# Patient Record
Sex: Female | Born: 1960
Health system: Southern US, Community
[De-identification: ages and names within clinical notes are randomized; demographics above are authoritative.]

## PROBLEM LIST (undated history)

## (undated) DIAGNOSIS — M542 Cervicalgia: Secondary | ICD-10-CM

## (undated) DIAGNOSIS — E559 Vitamin D deficiency, unspecified: Secondary | ICD-10-CM

## (undated) DIAGNOSIS — S6992XA Unspecified injury of left wrist, hand and finger(s), initial encounter: Secondary | ICD-10-CM

## (undated) DIAGNOSIS — K219 Gastro-esophageal reflux disease without esophagitis: Secondary | ICD-10-CM

## (undated) DIAGNOSIS — F988 Other specified behavioral and emotional disorders with onset usually occurring in childhood and adolescence: Secondary | ICD-10-CM

## (undated) DIAGNOSIS — F0781 Postconcussional syndrome: Secondary | ICD-10-CM

## (undated) DIAGNOSIS — F329 Major depressive disorder, single episode, unspecified: Secondary | ICD-10-CM

## (undated) DIAGNOSIS — F3289 Other specified depressive episodes: Secondary | ICD-10-CM

## (undated) DIAGNOSIS — N959 Unspecified menopausal and perimenopausal disorder: Secondary | ICD-10-CM

## (undated) HISTORY — DX: Gastro-esophageal reflux disease without esophagitis: K21.9

## (undated) HISTORY — DX: Vitamin D deficiency, unspecified: E55.9

## (undated) HISTORY — DX: Other specified behavioral and emotional disorders with onset usually occurring in childhood and adolescence: F98.8

## (undated) HISTORY — DX: Other specified depressive episodes: F32.89

## (undated) HISTORY — DX: Postconcussional syndrome: F07.81

## (undated) HISTORY — DX: Unspecified menopausal and perimenopausal disorder: N95.9

## (undated) HISTORY — DX: Unspecified injury of left wrist, hand and finger(s), initial encounter: S69.92XA

## (undated) HISTORY — DX: Cervicalgia: M54.2

## (undated) HISTORY — DX: Major depressive disorder, single episode, unspecified: F32.9

---

## 1983-04-24 HISTORY — PX: CHOLECYSTECTOMY: SHX55

## 1998-04-23 HISTORY — PX: BREAST SURGERY: SHX581

## 1999-02-08 ENCOUNTER — Ambulatory Visit (HOSPITAL_BASED_OUTPATIENT_CLINIC_OR_DEPARTMENT_OTHER): Admission: RE | Admit: 1999-02-08 | Discharge: 1999-02-08 | Payer: Self-pay | Admitting: Orthopedic Surgery

## 2000-12-06 ENCOUNTER — Other Ambulatory Visit: Admission: RE | Admit: 2000-12-06 | Discharge: 2000-12-06 | Payer: Self-pay | Admitting: *Deleted

## 2001-01-28 ENCOUNTER — Encounter: Admission: RE | Admit: 2001-01-28 | Discharge: 2001-01-28 | Payer: Self-pay | Admitting: *Deleted

## 2001-05-06 ENCOUNTER — Encounter (INDEPENDENT_AMBULATORY_CARE_PROVIDER_SITE_OTHER): Payer: Self-pay | Admitting: Specialist

## 2001-05-06 ENCOUNTER — Ambulatory Visit (HOSPITAL_BASED_OUTPATIENT_CLINIC_OR_DEPARTMENT_OTHER): Admission: RE | Admit: 2001-05-06 | Discharge: 2001-05-06 | Payer: Self-pay | Admitting: *Deleted

## 2002-02-23 ENCOUNTER — Other Ambulatory Visit: Admission: RE | Admit: 2002-02-23 | Discharge: 2002-02-23 | Payer: Self-pay | Admitting: Obstetrics and Gynecology

## 2004-04-06 ENCOUNTER — Other Ambulatory Visit: Admission: RE | Admit: 2004-04-06 | Discharge: 2004-04-06 | Payer: Self-pay | Admitting: Internal Medicine

## 2005-05-31 ENCOUNTER — Other Ambulatory Visit: Admission: RE | Admit: 2005-05-31 | Discharge: 2005-05-31 | Payer: Self-pay | Admitting: Internal Medicine

## 2007-08-21 ENCOUNTER — Encounter: Admission: RE | Admit: 2007-08-21 | Discharge: 2007-08-21 | Payer: Self-pay | Admitting: Internal Medicine

## 2008-05-24 DIAGNOSIS — F0781 Postconcussional syndrome: Secondary | ICD-10-CM

## 2008-05-24 HISTORY — DX: Postconcussional syndrome: F07.81

## 2008-06-03 ENCOUNTER — Emergency Department (HOSPITAL_COMMUNITY): Admission: EM | Admit: 2008-06-03 | Discharge: 2008-06-03 | Payer: Self-pay | Admitting: Emergency Medicine

## 2008-06-07 ENCOUNTER — Ambulatory Visit (HOSPITAL_COMMUNITY): Admission: RE | Admit: 2008-06-07 | Discharge: 2008-06-07 | Payer: Self-pay | Admitting: Internal Medicine

## 2008-08-06 ENCOUNTER — Encounter: Payer: Self-pay | Admitting: Internal Medicine

## 2008-08-12 ENCOUNTER — Encounter: Admission: RE | Admit: 2008-08-12 | Discharge: 2008-08-12 | Payer: Self-pay | Admitting: Neurological Surgery

## 2008-08-16 ENCOUNTER — Encounter: Payer: Self-pay | Admitting: Internal Medicine

## 2008-10-14 ENCOUNTER — Encounter: Payer: Self-pay | Admitting: Internal Medicine

## 2008-10-21 ENCOUNTER — Ambulatory Visit: Payer: Self-pay | Admitting: Internal Medicine

## 2008-10-21 DIAGNOSIS — F988 Other specified behavioral and emotional disorders with onset usually occurring in childhood and adolescence: Secondary | ICD-10-CM

## 2008-10-21 DIAGNOSIS — F0781 Postconcussional syndrome: Secondary | ICD-10-CM | POA: Insufficient documentation

## 2008-10-21 LAB — CONVERTED CEMR LAB
Basophils Absolute: 0.1 10*3/uL (ref 0.0–0.1)
HCT: 40.8 % (ref 36.0–46.0)
Hemoglobin: 13.8 g/dL (ref 12.0–15.0)
Lymphs Abs: 1.5 10*3/uL (ref 0.7–4.0)
MCV: 93.6 fL (ref 78.0–100.0)
Monocytes Relative: 6.3 % (ref 3.0–12.0)
Neutro Abs: 4.8 10*3/uL (ref 1.4–7.7)
RDW: 12.3 % (ref 11.5–14.6)
TSH: 1.64 microintl units/mL (ref 0.35–5.50)

## 2008-10-26 ENCOUNTER — Encounter (INDEPENDENT_AMBULATORY_CARE_PROVIDER_SITE_OTHER): Payer: Self-pay | Admitting: *Deleted

## 2008-10-26 ENCOUNTER — Encounter: Payer: Self-pay | Admitting: Internal Medicine

## 2008-10-26 DIAGNOSIS — E559 Vitamin D deficiency, unspecified: Secondary | ICD-10-CM | POA: Insufficient documentation

## 2008-11-08 ENCOUNTER — Encounter: Payer: Self-pay | Admitting: Internal Medicine

## 2008-11-26 ENCOUNTER — Telehealth: Payer: Self-pay | Admitting: Internal Medicine

## 2008-12-02 ENCOUNTER — Ambulatory Visit: Payer: Self-pay | Admitting: Internal Medicine

## 2008-12-02 DIAGNOSIS — G609 Hereditary and idiopathic neuropathy, unspecified: Secondary | ICD-10-CM | POA: Insufficient documentation

## 2008-12-02 DIAGNOSIS — R259 Unspecified abnormal involuntary movements: Secondary | ICD-10-CM | POA: Insufficient documentation

## 2008-12-02 DIAGNOSIS — F329 Major depressive disorder, single episode, unspecified: Secondary | ICD-10-CM

## 2008-12-17 ENCOUNTER — Ambulatory Visit: Payer: Self-pay | Admitting: Psychology

## 2008-12-30 ENCOUNTER — Telehealth: Payer: Self-pay | Admitting: Internal Medicine

## 2008-12-31 ENCOUNTER — Ambulatory Visit: Payer: Self-pay | Admitting: Internal Medicine

## 2009-01-06 ENCOUNTER — Telehealth: Payer: Self-pay | Admitting: Internal Medicine

## 2009-01-06 ENCOUNTER — Encounter: Payer: Self-pay | Admitting: Internal Medicine

## 2009-02-01 ENCOUNTER — Telehealth: Payer: Self-pay | Admitting: Internal Medicine

## 2009-02-04 ENCOUNTER — Ambulatory Visit: Payer: Self-pay | Admitting: Internal Medicine

## 2009-02-08 ENCOUNTER — Encounter: Payer: Self-pay | Admitting: Internal Medicine

## 2009-03-07 ENCOUNTER — Ambulatory Visit: Payer: Self-pay | Admitting: Internal Medicine

## 2009-04-12 ENCOUNTER — Telehealth: Payer: Self-pay | Admitting: Internal Medicine

## 2009-05-16 ENCOUNTER — Telehealth: Payer: Self-pay | Admitting: Internal Medicine

## 2009-06-03 ENCOUNTER — Ambulatory Visit: Payer: Self-pay | Admitting: Internal Medicine

## 2009-07-13 ENCOUNTER — Telehealth: Payer: Self-pay | Admitting: Internal Medicine

## 2009-08-17 ENCOUNTER — Telehealth: Payer: Self-pay | Admitting: Internal Medicine

## 2009-09-16 ENCOUNTER — Telehealth: Payer: Self-pay | Admitting: Internal Medicine

## 2009-10-11 ENCOUNTER — Ambulatory Visit: Payer: Self-pay | Admitting: Internal Medicine

## 2009-10-11 DIAGNOSIS — K117 Disturbances of salivary secretion: Secondary | ICD-10-CM | POA: Insufficient documentation

## 2009-10-12 LAB — CONVERTED CEMR LAB
Folate: 14.1 ng/mL
TSH: 1.33 microintl units/mL (ref 0.35–5.50)
Vitamin B-12: 308 pg/mL (ref 211–911)

## 2009-11-21 ENCOUNTER — Telehealth: Payer: Self-pay | Admitting: Internal Medicine

## 2009-12-23 ENCOUNTER — Telehealth: Payer: Self-pay | Admitting: Internal Medicine

## 2010-01-20 ENCOUNTER — Telehealth (INDEPENDENT_AMBULATORY_CARE_PROVIDER_SITE_OTHER): Payer: Self-pay | Admitting: *Deleted

## 2010-01-30 ENCOUNTER — Telehealth: Payer: Self-pay | Admitting: Internal Medicine

## 2010-03-06 ENCOUNTER — Telehealth: Payer: Self-pay | Admitting: Internal Medicine

## 2010-03-28 ENCOUNTER — Telehealth: Payer: Self-pay | Admitting: Internal Medicine

## 2010-04-11 ENCOUNTER — Telehealth: Payer: Self-pay | Admitting: Internal Medicine

## 2010-05-13 ENCOUNTER — Encounter: Payer: Self-pay | Admitting: Internal Medicine

## 2010-05-21 LAB — CONVERTED CEMR LAB
ALT: 20 units/L (ref 0–35)
AST: 21 units/L (ref 0–37)
BUN: 10 mg/dL (ref 6–23)
Bilirubin Urine: NEGATIVE
Bilirubin, Direct: 0.2 mg/dL (ref 0.0–0.3)
Cholesterol: 161 mg/dL (ref 0–200)
Creatinine, Ser: 0.9 mg/dL (ref 0.4–1.2)
Eosinophils Relative: 1 % (ref 0.0–5.0)
GFR calc non Af Amer: 71.23 mL/min (ref >60)
Ketones, ur: NEGATIVE mg/dL
LDL Cholesterol: 82 mg/dL (ref 0–99)
Monocytes Relative: 7.5 % (ref 3.0–12.0)
Neutrophils Relative %: 68 % (ref 43.0–77.0)
Nitrite: NEGATIVE
Platelets: 234 10*3/uL (ref 150.0–400.0)
Total Bilirubin: 0.4 mg/dL (ref 0.3–1.2)
Total Protein, Urine: NEGATIVE mg/dL
Triglycerides: 36 mg/dL (ref 0.0–149.0)
VLDL: 7.2 mg/dL (ref 0.0–40.0)
WBC: 6.3 10*3/uL (ref 4.5–10.5)
pH: 6.5 (ref 5.0–8.0)

## 2010-05-23 NOTE — Progress Notes (Signed)
  Phone Note Outgoing Call   Details for Reason: Shred rx Summary of Call: Pt called for rx for vyvance on 03/06/10. Never pick-up. Putting in shred box Initial call taken by: Orlan Leavens RMA,  March 28, 2010 1:39 PM

## 2010-05-23 NOTE — Progress Notes (Signed)
Summary: Adderall  Phone Note Call from Patient Call back at Home Phone [phone]   Caller: Patient Summary of Call: pt called requesting refill of Adderall Initial call taken by: Margaret Pyle, CMA,  May 16, 2009 11:58 AM  Follow-up for Phone Call        ok- please print and i will sign - thanks Follow-up by: Newt Lukes MD,  May 16, 2009 12:02 PM  Additional Follow-up for Phone Call Additional follow up Details #1::        rx signed by MD, pt informed, rx in cabinet for pick up Additional Follow-up by: Margaret Pyle, CMA,  May 16, 2009 2:45 PM    Prescriptions: ADDERALL 20 MG TABS (AMPHETAMINE-DEXTROAMPHETAMINE) take 1 by mouth once daily  #30 x 0   Entered by:   Margaret Pyle, CMA   Authorized by:   Newt Lukes MD   Signed by:   Margaret Pyle, CMA on 05/16/2009   Method used:   Print then Give to Patient   RxID:   [id number]

## 2010-05-23 NOTE — Progress Notes (Signed)
Summary: vyvanse  Phone Note Call from Patient Call back at Home Phone [phone]   Caller: Patient Reason for Call: Refill Medication Summary of Call: Pt called requesting refill of Vyvanse-[facility] pt Initial call taken by: Brenton Grills MA,  December 23, 2009 3:20 PM  Follow-up for Phone Call        ok to print and i will sign - thanks Follow-up by: Newt Lukes MD,  December 27, 2009 8:22 AM  Additional Follow-up for Phone Call Additional follow up Details #1::        Pt informed Rx in cabinet for pt pick up Additional Follow-up by: Margaret Pyle, CMA,  December 27, 2009 8:25 AM    Prescriptions: VYVANSE 30 MG CAPS (LISDEXAMFETAMINE DIMESYLATE) 1 by mouth every morning  #30 x 0   Entered by:   Margaret Pyle, CMA   Authorized by:   Newt Lukes MD   Signed by:   Margaret Pyle, CMA on 12/27/2009   Method used:   Print then Give to Patient   RxID:   [id number]

## 2010-05-23 NOTE — Progress Notes (Signed)
Summary: Vyvanse  Phone Note Call from Patient Call back at Home Phone [phone]   Caller: Patient Summary of Call: pt called requesting refills of Vyvanse Initial call taken by: Margaret Pyle, CMA,  July 13, 2009 9:39 AM  Follow-up for Phone Call        ok - pls print and i will sign for pt to pick up Follow-up by: Newt Lukes MD,  July 13, 2009 11:00 AM  Additional Follow-up for Phone Call Additional follow up Details #1::        pt informed via VM, rx in cabinet for pt pick up Additional Follow-up by: Margaret Pyle, CMA,  July 13, 2009 11:46 AM    Prescriptions: VYVANSE 30 MG CAPS (LISDEXAMFETAMINE DIMESYLATE) 1 by mouth every morning  #30 x 0   Entered by:   Orlan Leavens   Authorized by:   Newt Lukes MD   Signed by:   Orlan Leavens on 07/13/2009   Method used:   Print then Give to Patient   RxID:   [id number]

## 2010-05-23 NOTE — Assessment & Plan Note (Signed)
Summary: DRY MOUTH/NWS   Vital Signs:  Patient profile:   50 year old female Height:      64 inches (162.56 cm) Weight:      131 pounds (59.55 kg) BMI:     22.57 O2 Sat:      97 % on Room air Temp:     97.8 degrees F (36.56 degrees C) oral Pulse rate:   63 / minute BP sitting:   92 / 70  (left arm) Cuff size:   regular  Vitals Entered By: Orlan Leavens (October 11, 2009 1:33 PM)  O2 Flow:  Room air CC: Dry mouth Is Patient Diabetic? No Pain Assessment Patient in pain? no        Primary Care Provider:  Newt Lukes MD  CC:  Dry mouth.  History of Present Illness: c/o dry mouth also dry eye, dry skin, dry hair no change in meds -  ADHD - better on vyvanse than adderall  Clinical Review Panels:  Lipid Management   Cholesterol:  161 (06/03/2009)   LDL (bad choesterol):  82 (06/03/2009)   HDL (good cholesterol):  72.20 (06/03/2009)  CBC   WBC:  6.3 (06/03/2009)   RBC:  4.06 (06/03/2009)   Hgb:  13.2 (06/03/2009)   Hct:  39.4 (06/03/2009)   Platelets:  234.0 (06/03/2009)   MCV  97.0 (06/03/2009)   MCHC  33.5 (06/03/2009)   RDW  12.3 (06/03/2009)   PMN:  68.0 (06/03/2009)   Lymphs:  20.8 (06/03/2009)   Monos:  7.5 (06/03/2009)   Eosinophils:  1.0 (06/03/2009)   Basophil:  2.7 (06/03/2009)  Complete Metabolic Panel   Glucose:  99 (06/03/2009)   Sodium:  138 (06/03/2009)   Potassium:  4.6 (06/03/2009)   Chloride:  102 (06/03/2009)   CO2:  26 (06/03/2009)   BUN:  10 (06/03/2009)   Creatinine:  0.9 (06/03/2009)   Albumin:  3.9 (06/03/2009)   Total Protein:  6.6 (06/03/2009)   Calcium:  9.3 (06/03/2009)   Total Bili:  0.4 (06/03/2009)   Alk Phos:  41 (06/03/2009)   SGPT (ALT):  20 (06/03/2009)   SGOT (AST):  21 (06/03/2009)   Current Medications (verified): 1)  Vyvanse 30 Mg Caps (Lisdexamfetamine Dimesylate) .Marland Kitchen.. 1 By Mouth Every Morning 2)  Vitamin D3 1000 Unit Tabs (Cholecalciferol) .... 2 Tabs By Mouth Once Daily 3)  Prozac 10 Mg Caps  (Fluoxetine Hcl) .... 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 Danielle Dess depression ADD  Review of Systems  The patient denies dyspnea on exertion, headaches, and abdominal pain.    Physical Exam  General:  alert, well-developed, well-nourished, and cooperative to examination.    Nose:  External nasal examination shows no deformity or inflammation. Nasal mucosa are pink and moist without lesions or exudates. Mouth:  teeth and gums in good repair; mucous membranes dry but without lesions or ulcers. oropharynx clear without exudate or erythema.  Lungs:  normal respiratory effort, no intercostal retractions or use of accessory muscles; normal breath sounds bilaterally - no crackles and no wheezes.    Heart:  normal rate, regular rhythm, no murmur, and no rub. BLE without edema.   Impression & Recommendations:  Problem # 1:  DRY MOUTH (ICD-527.7)  ?med effect of vyvanse or prozac - r/o med dz with labs - consider pilocarpine if sugarfee gum/candy  not helpful  Orders: TLB-B12 + Folate Pnl (82746_82607-B12/FOL) TLB-TSH (Thyroid Stimulating Hormone) (84443-TSH) T-Sjogrens Syndrome Ab ([address])  Problem # 2:  ADD (ICD-314.00) cont vyvanse as symptoms are better controlled and longer acting benefit than prior adderall tx  Complete Medication List: 1)  Vyvanse 30 Mg Caps (Lisdexamfetamine dimesylate) .Marland Kitchen.. 1 by mouth every morning 2)  Vitamin D3 1000 Unit Tabs (Cholecalciferol) .... 2 tabs by mouth once daily 3)  Prozac 10 Mg Caps (Fluoxetine hcl) .... Once daily  Patient Instructions: 1)  it was good to see you today.  2)  test(s) ordered today for evaluation of dry mouth- your results will be posted on the phone tree for review in 48-72 hours from the time of test completion; call [phone] and enter  your 9 digit MRN (listed above on this page, just below your name); if any changes need to be made or there are abnormal results, you will be contacted directly.  3)  continue sugarless gum/candy pending these results 4)  continue vyvanse as discussed for ADHD - 5)  Please schedule a follow-up appointment in 3-4 months to re-evaluate ADHD symptoms, sooner if problems.  Prescriptions: VYVANSE 30 MG CAPS (LISDEXAMFETAMINE DIMESYLATE) 1 by mouth every morning  #30 x 0   Entered and Authorized by:   Newt Lukes MD   Signed by:   Newt Lukes MD on 10/11/2009   Method used:   Print then Give to Patient   RxID:   [id number]

## 2010-05-23 NOTE — Progress Notes (Signed)
Summary: Vyvanse  Phone Note Call from Patient Call back at Home Phone [phone]   Caller: Patient Summary of Call: Pt called requesting refill of Vyvanse, okay to generate Rx for signature? Initial call taken by: Margaret Pyle, CMA,  March 06, 2010 8:34 AM  Follow-up for Phone Call        yes - thanks Follow-up by: Newt Lukes MD,  March 06, 2010 9:45 AM  Additional Follow-up for Phone Call Additional follow up Details #1::        Pt informed, Rx in cabinet for pt pick up Additional Follow-up by: Margaret Pyle, CMA,  March 06, 2010 11:04 AM    Prescriptions: VYVANSE 30 MG CAPS (LISDEXAMFETAMINE DIMESYLATE) 1 by mouth every morning  #30 x 0   Entered by:   Margaret Pyle, CMA   Authorized by:   Newt Lukes MD   Signed by:   Margaret Pyle, CMA on 03/06/2010   Method used:   Print then Give to Patient   RxID:   [id number]

## 2010-05-23 NOTE — Progress Notes (Signed)
  Phone Note Other Incoming   Request: Send information Summary of Call: Request for records received from [facility]. Request forwarded to [facility].     

## 2010-05-23 NOTE — Progress Notes (Signed)
Summary: Vyvanse  Phone Note Call from Patient Call back at Home Phone [phone]   Caller: Patient Summary of Call: Pt called requesting refill of Vyvanse Initial call taken by: Margaret Pyle, CMA,  November 21, 2009 11:23 AM  Follow-up for Phone Call        ok - done Follow-up by: Newt Lukes MD,  November 21, 2009 12:01 PM  Additional Follow-up for Phone Call Additional follow up Details #1::        pt informed via VM, Rx in cabinet for pt pick up Additional Follow-up by: Margaret Pyle, CMA,  November 21, 2009 12:07 PM    Prescriptions: VYVANSE 30 MG CAPS (LISDEXAMFETAMINE DIMESYLATE) 1 by mouth every morning  #30 x 0   Entered and Authorized by:   Newt Lukes MD   Signed by:   Newt Lukes MD on 11/21/2009   Method used:   Print then Give to Patient   RxID:   [id number]

## 2010-05-23 NOTE — Progress Notes (Signed)
Summary: Vyvanse  Phone Note Call from Patient Call back at Home Phone [phone] Call back at Work Phone [phone]   Caller: Patient Summary of Call: pt called requesting refill of Vyvanse Initial call taken by: Margaret Pyle, CMA,  Sep 16, 2009 10:22 AM  Follow-up for Phone Call        done Follow-up by: Newt Lukes MD,  Sep 16, 2009 10:29 AM  Additional Follow-up for Phone Call Additional follow up Details #1::        pt informed via VM, Rx in cabinet for pt pick up Additional Follow-up by: Margaret Pyle, CMA,  Sep 16, 2009 10:33 AM    Prescriptions: VYVANSE 30 MG CAPS (LISDEXAMFETAMINE DIMESYLATE) 1 by mouth every morning  #30 x 0   Entered and Authorized by:   Newt Lukes MD   Signed by:   Newt Lukes MD on 09/16/2009   Method used:   Print then Give to Patient   RxID:   [id number]

## 2010-05-23 NOTE — Assessment & Plan Note (Signed)
Summary: cpx / #/cd   Vital Signs:  Patient profile:   50 year old female Height:      64 inches (162.56 cm) Weight:      131.4 pounds (59.73 kg) O2 Sat:      99 % on Room air Temp:     97.8 degrees F (36.56 degrees C) oral Pulse rate:   83 / minute BP sitting:   102 / 68  (left arm) Cuff size:   regular  Vitals Entered By: Orlan Leavens (June 03, 2009 9:59 AM)  O2 Flow:  Room air CC: CPX Is Patient Diabetic? No Pain Assessment Patient in pain? no        Primary Care Provider:  Newt Lukes MD  CC:  CPX.  History of Present Illness: patient is here today for annual physical. Patient feels well and has no complaints.   Preventive Screening-Counseling & Management  Alcohol-Tobacco     Alcohol drinks/day: <1     Smoking Status: never     Tobacco Counseling: not indicated; no tobacco use  Caffeine-Diet-Exercise     Caffeine Counseling: not indicated; caffeine use is not excessive or problematic     Does Patient Exercise: yes     Exercise Counseling: not indicated; exercise is adequate     Depression Counseling: not indicated; screening negative for depression  Safety-Violence-Falls     Seat Belt Use: yes     Seat Belt Counseling: not indicated; patient wears seat belts     Helmet Use: yes     Helmet Counseling: not indicated; patient wears helmet when riding bicycle/motocycle     Firearms in the Home: no firearms in the home     Firearm Counseling: not applicable     Smoke Detectors: yes     Smoke Detector Counseling: no     Violence in the Home: no risk noted     Violence Counseling: not indicated; no violence risk noted     Fall Risk Counseling: not indicated; no significant falls noted  Clinical Review Panels:  Prevention   Last Mammogram:  normal (04/24/2007)   Last Pap Smear:  normal (07/23/2007)  Immunizations   Last Tetanus Booster:  Td (04/23/2005)   Current Medications (verified): 1)  Adderall 20 Mg Tabs (Amphetamine-Dextroamphetamine)  .... Take 1 By Mouth Once Daily 2)  Vitamin D3 1000 Unit Tabs (Cholecalciferol) .... 2 Tabs By Mouth Once Daily 3)  Prozac 10 Mg Caps (Fluoxetine Hcl) .... Once Daily  Allergies (verified): No Known Drug Allergies  Past History:  Past medical, surgical, family and social histories (including risk factors) reviewed, and no changes noted (except as noted below).  Past Medical History: Reviewed history from 12/02/2008 and no changes required. 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 Danielle Dess depression ADD  Past Surgical History: Reviewed history from 10/21/2008 and no changes required. Cholecystectomy (1990) Breast biopsy (2000)  Family History: Reviewed history from 10/21/2008 and no changes required. Family History Hypertension (parent,grandparents,other blood relative) Family History of Arthritis (parents) Family History Osteoporosis  Social History: Reviewed history from 10/21/2008 and no changes required. Never Smoked social alcohol use - lives with domestic partner and their 2 children family law attorneyDoes Patient Exercise:  yes Seat Belt Use:  yes  Review of Systems       see HPI above. I have reviewed all other systems and they were negative.   Physical Exam  General:  alert,  well-developed, well-nourished, and cooperative to examination.    Eyes:  vision grossly intact; pupils equal, round and reactive to light.  conjunctiva and lids normal.    Ears:  normal pinnae bilaterally, without erythema, swelling, or tenderness to palpation. TMs clear, without effusion, or cerumen impaction. Hearing grossly normal bilaterally  Mouth:  teeth and gums in good repair; mucous membranes moist, without lesions or ulcers. oropharynx clear without exudate, no erythema.  Neck:  supple, full ROM, no masses, no thyromegaly; no thyroid nodules or tenderness. no JVD or carotid bruits.   Lungs:  normal  respiratory effort, no intercostal retractions or use of accessory muscles; normal breath sounds bilaterally - no crackles and no wheezes.    Heart:  normal rate, regular rhythm, no murmur, and no rub. BLE without edema. normal DP pulses and normal cap refill in all 4 extremities    Abdomen:  soft, non-tender, normal bowel sounds, no distention; no masses and no appreciable hepatomegaly or splenomegaly.   Genitalia:  defer to gyn Msk:  No deformity or scoliosis noted of thoracic or lumbar spine.   Neurologic:  alert & oriented X3 and cranial nerves II-XII symetrically intact.  strength normal in all extremities, sensation intact to light touch, and gait normal. speech fluent without dysarthria or aphasia; follows commands with good comprehension.  Skin:  no rashes, vesicles, ulcers, or erythema. No nodules or irregularity to palpation.  Psych:  Oriented X3, memory intact for recent and remote, normally interactive, good eye contact, not anxious appearing, not depressed appearing, and not agitated.      Impression & Recommendations:  Problem # 1:  PREVENTIVE HEALTH CARE (ICD-V70.0) Patient has been counseled on age-appropriate routine health concerns for screening and prevention. These are reviewed and up-to-date. Immunizations are up-to-date or declined. Labs and ECG reviewed.  Orders: EKG w/ Interpretation (93000) TLB-Lipid Panel (80061-LIPID) TLB-BMP (Basic Metabolic Panel-BMET) (80048-METABOL) TLB-CBC Platelet - w/Differential (85025-CBCD) TLB-Hepatic/Liver Function Pnl (80076-HEPATIC) TLB-TSH (Thyroid Stimulating Hormone) (84443-TSH) T-Vitamin D (25-Hydroxy) (91478-29562) TLB-Udip ONLY (81003-UDIP) Gynecologic Referral (Gyn) Misc. Referral (Misc. Ref)  Complete Medication List: 1)  Vyvanse 30 Mg Caps (Lisdexamfetamine dimesylate) .Marland Kitchen.. 1 by mouth every morning 2)  Vitamin D3 1000 Unit Tabs (Cholecalciferol) .... 2 tabs by mouth once daily 3)  Prozac 10 Mg Caps (Fluoxetine hcl) ....  Once daily  Patient Instructions: 1)  it was good to see you today.  2)  test(s) ordered today - your results will be posted on the phone tree for review in 48-72 hours from the time of test completion; call [phone] and enter your 9 digit MRN (listed above on this page, just below your name); if any changes need to be made or there are abnormal results, you will be contacted directly.  3)  change adderall to vyvanse as discussed for ADHD - if this is not effective, can change back to adderall or consider increased dose on vyvanse 4)  we'll make referral to [facility] ob-gyn for PAP and [facility] for mammogram. Our office will contact you regarding this appointment once made.  5)  Please schedule a follow-up appointment in 3-4 months to re-evaluate ADHD symptoms, sooner if problems.  Prescriptions: VYVANSE 30 MG CAPS (LISDEXAMFETAMINE DIMESYLATE) 1 by mouth every morning  #30 x 0   Entered and Authorized by:   Newt Lukes MD   Signed by:   Newt Lukes MD on 06/03/2009   Method used:   Print then Give to Patient   RxID:   [id number]

## 2010-05-23 NOTE — Progress Notes (Signed)
Summary: Vyvanse  Phone Note Call from Patient Call back at Home Phone [phone]   Caller: Patient Summary of Call: Pt called requesting refill of Vyvanse Initial call taken by: Margaret Pyle, CMA,  January 30, 2010 1:49 PM  Follow-up for Phone Call        ok - done Follow-up by: Newt Lukes MD,  January 30, 2010 2:17 PM  Additional Follow-up for Phone Call Additional follow up Details #1::        Pt informed, Rx in cabinet for pt pick up Additional Follow-up by: Margaret Pyle, CMA,  January 30, 2010 2:33 PM    Prescriptions: VYVANSE 30 MG CAPS (LISDEXAMFETAMINE DIMESYLATE) 1 by mouth every morning  #30 x 0   Entered and Authorized by:   Newt Lukes MD   Signed by:   Newt Lukes MD on 01/30/2010   Method used:   Print then Give to Patient   RxID:   [id number]

## 2010-05-23 NOTE — Progress Notes (Signed)
Summary: Vyvanse  Phone Note Call from Patient Call back at Work Phone [phone]   Caller: Patient Summary of Call: pt called requesting refills of Vyvanse Initial call taken by: Margaret Pyle, CMA,  August 17, 2009 11:43 AM  Follow-up for Phone Call        done - signed rx on triage b desk Follow-up by: Newt Lukes MD,  August 17, 2009 12:07 PM  Additional Follow-up for Phone Call Additional follow up Details #1::        pt informed via VM, RX in cabinet for pick up Additional Follow-up by: Margaret Pyle, CMA,  August 17, 2009 1:04 PM    Prescriptions: VYVANSE 30 MG CAPS (LISDEXAMFETAMINE DIMESYLATE) 1 by mouth every morning  #30 x 0   Entered and Authorized by:   Newt Lukes MD   Signed by:   Newt Lukes MD on 08/17/2009   Method used:   Print then Give to Patient   RxID:   [id number]

## 2010-05-24 IMAGING — CR DG FACIAL BONES COMPLETE 3+V
5 series · 5 of 5 positions shown · non-contrast
Comparison: CT 06/03/2008

CLINICAL DATA: Posterior skull pain.  Headache.  Bruising right
eye.Fall 06/03/2008

FACIAL BONES COMPLETE 3+V

[view not recorded (1 of 5)]
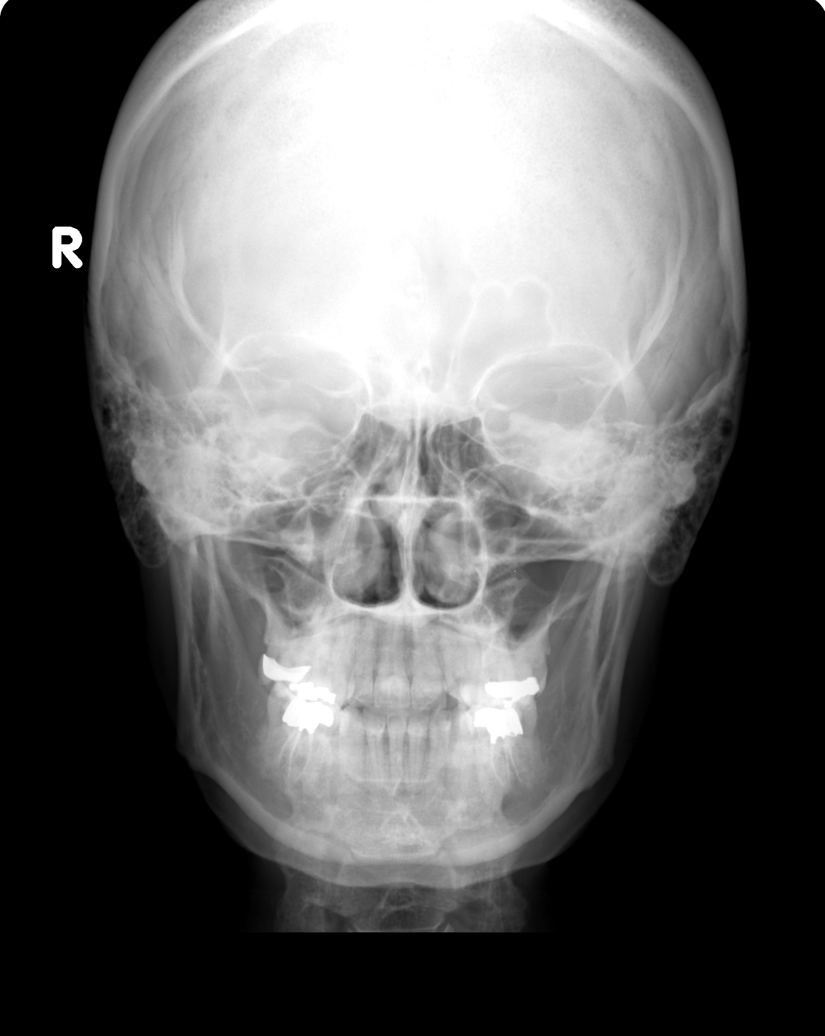

[view not recorded (2 of 5)]
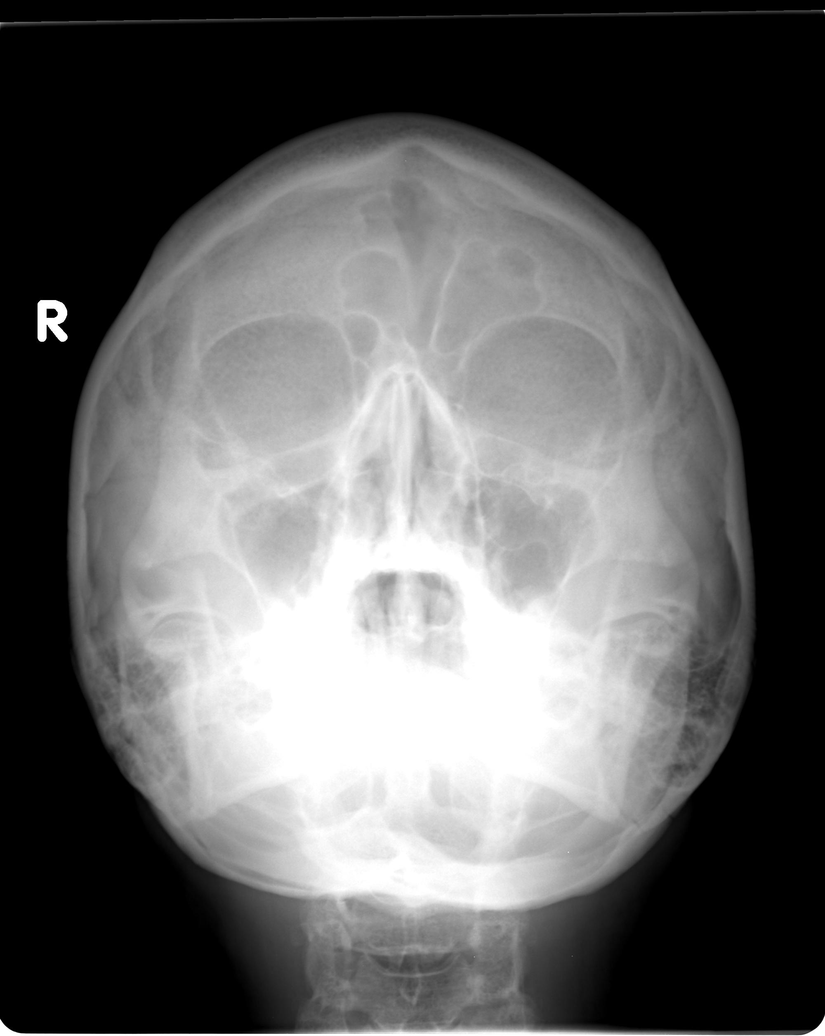

[view not recorded (3 of 5)]
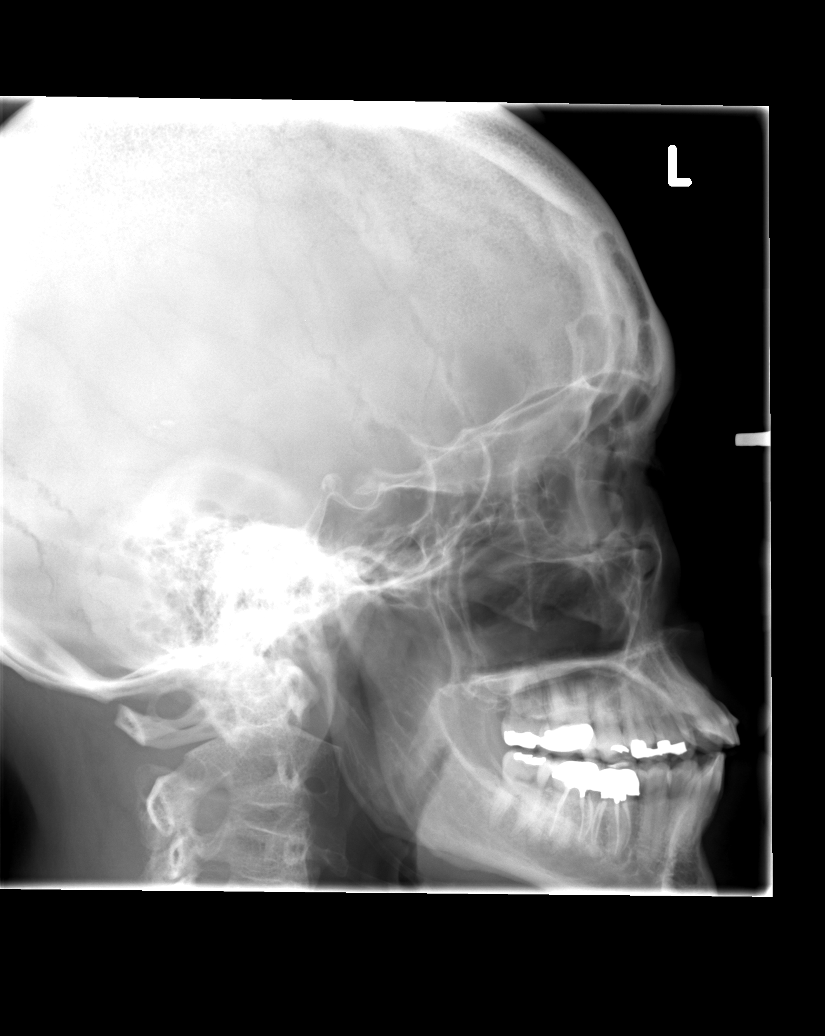

[view not recorded (4 of 5)]
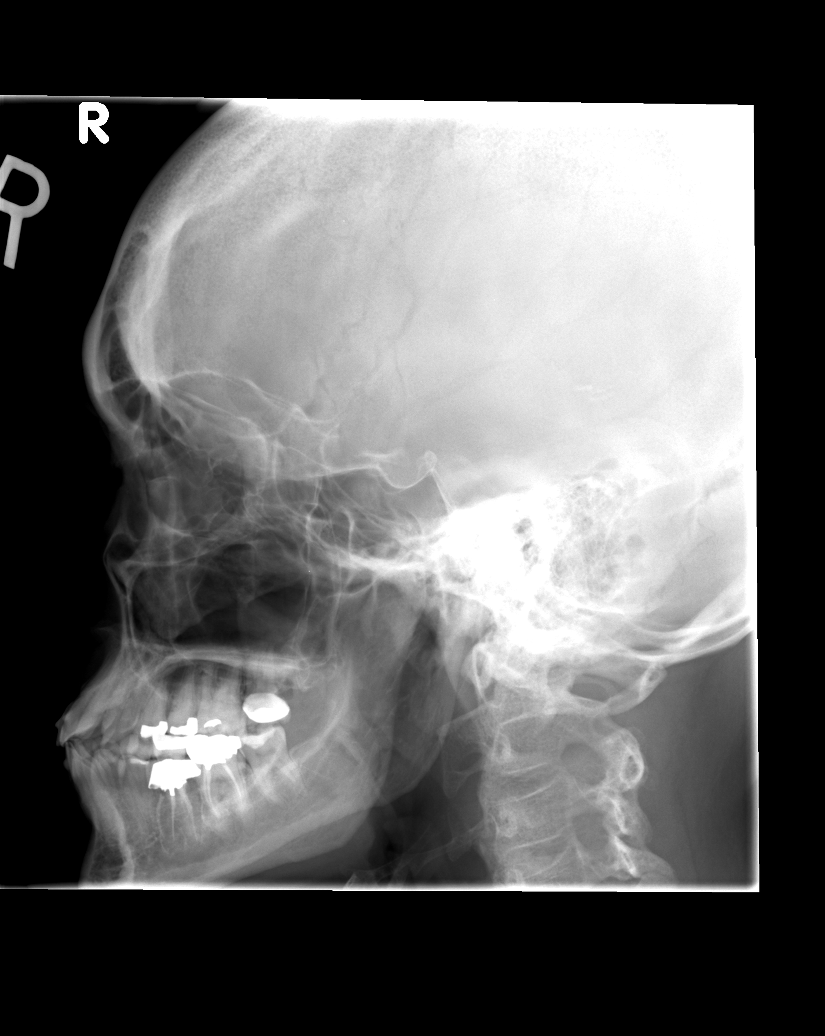

[view not recorded (5 of 5)]
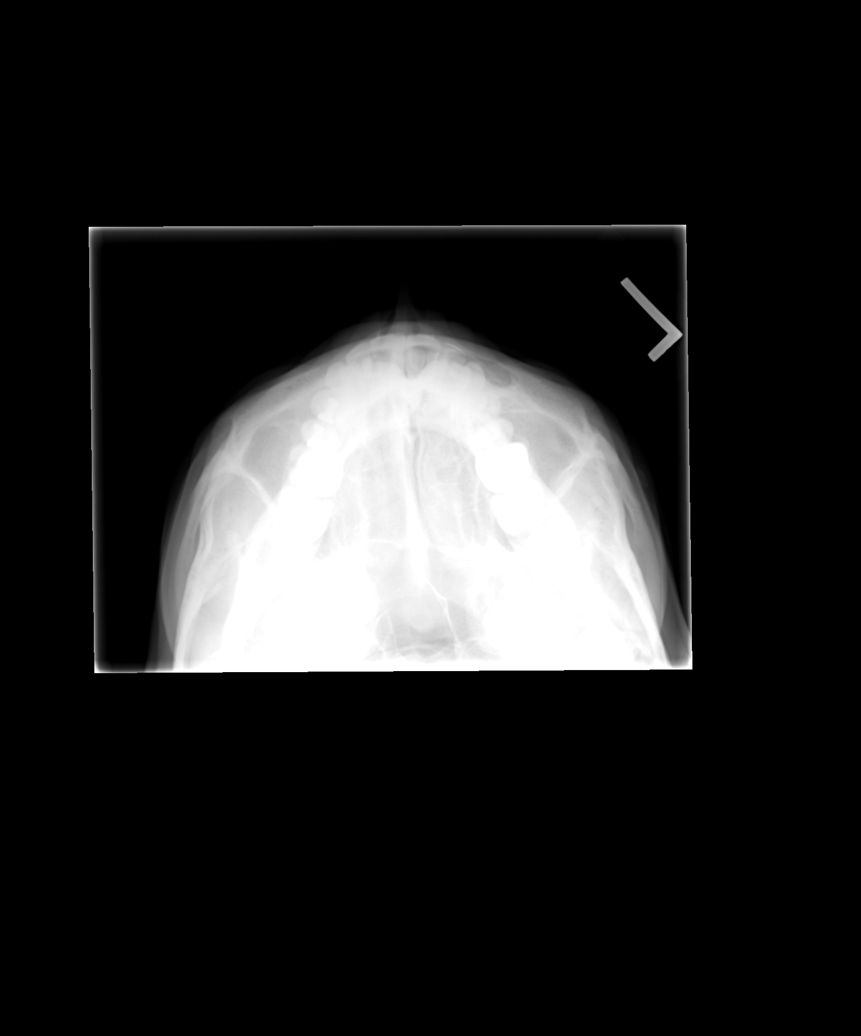

[5 of 5 positions shown; findings below may reference images not displayed]

FINDINGS: There is no evidence for acute fracture or subluxation.
The orbits are intact.  Visualized paranasal sinuses show no air
fluid levels to suggest sinus wall fractures.  Mastoids appear well
aerated.  There is no evidence for intraorbital emphysema on the
current exam.
IMPRESSION: No evidence for acute abnormality.

## 2010-05-25 NOTE — Progress Notes (Signed)
Summary: Vyvanse  Phone Note Call from Patient Call back at Home Phone [phone]   Caller: Patient Summary of Call: Pt called requesting refill of Vyvanse Initial call taken by: Margaret Pyle, CMA,  April 11, 2010 10:52 AM  Follow-up for Phone Call        ok - i will sign for pt pick up Follow-up by: Newt Lukes MD,  April 11, 2010 11:17 AM  Additional Follow-up for Phone Call Additional follow up Details #1::        Pt informed via VM, Rx in cabinet for pt pick up Additional Follow-up by: Margaret Pyle, CMA,  April 11, 2010 12:34 PM    Prescriptions: VYVANSE 30 MG CAPS (LISDEXAMFETAMINE DIMESYLATE) 1 by mouth every morning  #30 x 0   Entered and Authorized by:   Newt Lukes MD   Signed by:   Newt Lukes MD on 04/11/2010   Method used:   Print then Give to Patient   RxID:   [id number]

## 2010-05-30 ENCOUNTER — Telehealth (INDEPENDENT_AMBULATORY_CARE_PROVIDER_SITE_OTHER): Payer: Self-pay | Admitting: *Deleted

## 2010-06-08 NOTE — Progress Notes (Signed)
  Phone Note Other Incoming   Request: Send information Summary of Call: Request for records received from [facility]. Request forwarded to [facility].  Entire chart for the last 5 yrs-[facility]

## 2010-06-30 ENCOUNTER — Encounter: Payer: Self-pay | Admitting: Internal Medicine

## 2010-06-30 ENCOUNTER — Ambulatory Visit (INDEPENDENT_AMBULATORY_CARE_PROVIDER_SITE_OTHER): Payer: BC Managed Care – PPO | Admitting: Internal Medicine

## 2010-06-30 ENCOUNTER — Other Ambulatory Visit: Payer: BC Managed Care – PPO

## 2010-06-30 ENCOUNTER — Other Ambulatory Visit: Payer: Self-pay | Admitting: Internal Medicine

## 2010-06-30 DIAGNOSIS — Z0000 Encounter for general adult medical examination without abnormal findings: Secondary | ICD-10-CM

## 2010-06-30 DIAGNOSIS — N959 Unspecified menopausal and perimenopausal disorder: Secondary | ICD-10-CM

## 2010-06-30 DIAGNOSIS — M542 Cervicalgia: Secondary | ICD-10-CM | POA: Insufficient documentation

## 2010-06-30 LAB — CBC WITH DIFFERENTIAL/PLATELET
Basophils Absolute: 0.1 10*3/uL (ref 0.0–0.1)
Basophils Relative: 0.9 % (ref 0.0–3.0)
Eosinophils Absolute: 0 10*3/uL (ref 0.0–0.7)
HCT: 39.2 % (ref 36.0–46.0)
Hemoglobin: 13.5 g/dL (ref 12.0–15.0)
Lymphocytes Relative: 21.2 % (ref 12.0–46.0)
Lymphs Abs: 1.5 10*3/uL (ref 0.7–4.0)
MCHC: 34.4 g/dL (ref 30.0–36.0)
MCV: 94 fl (ref 78.0–100.0)
Monocytes Absolute: 0.5 10*3/uL (ref 0.1–1.0)
Neutro Abs: 4.9 10*3/uL (ref 1.4–7.7)
RDW: 13.1 % (ref 11.5–14.6)

## 2010-06-30 LAB — BASIC METABOLIC PANEL
Chloride: 102 mEq/L (ref 96–112)
Creatinine, Ser: 0.8 mg/dL (ref 0.4–1.2)
GFR: 87.51 mL/min (ref >60.00)

## 2010-06-30 LAB — LIPID PANEL
Cholesterol: 152 mg/dL (ref 0–200)
LDL Cholesterol: 79 mg/dL (ref 0–99)
Triglycerides: 35 mg/dL (ref 0.0–149.0)

## 2010-07-03 LAB — LUTEINIZING HORMONE: LH: 92.4 m[IU]/mL

## 2010-07-04 NOTE — Assessment & Plan Note (Signed)
Summary: FU---STC   Vital Signs:  Patient profile:   50 year old female Weight:      120.4 pounds (54.73 kg) O2 Sat:      97 % on Room air Temp:     97.6 degrees F (36.44 degrees C) oral Pulse rate:   103 / minute BP sitting:   100 / 72  (left arm) Cuff size:   regular  Vitals Entered By: Orlan Leavens RMA (June 30, 2010 11:07 AM)  O2 Flow:  Room air CC: CPX Is Patient Diabetic? No Pain Assessment Patient in pain? no        Primary Care Provider:  Newt Lukes MD  CC:  CPX.  History of Present Illness: patient is here today for annual physical. Patient feels well and has no complaints.   concerned about possible menopauses symptoms - forgetfulness, hot flashes, last menses >9mo ago   ADHD - psyc changed vyvanse back to adderall, feels improved  Preventive Screening-Counseling & Management  Alcohol-Tobacco     Alcohol drinks/day: <1     Alcohol Counseling: not indicated; use of alcohol is not excessive or problematic     Smoking Status: never     Tobacco Counseling: not indicated; no tobacco use  Caffeine-Diet-Exercise     Caffeine Counseling: not indicated; caffeine use is not excessive or problematic     Does Patient Exercise: yes     Exercise Counseling: not indicated; exercise is adequate     Depression Counseling: not indicated; screening negative for depression  Safety-Violence-Falls     Seat Belt Use: yes     Seat Belt Counseling: not indicated; patient wears seat belts     Helmet Use: yes     Helmet Counseling: not indicated; patient wears helmet when riding bicycle/motocycle     Firearms in the Home: no firearms in the home     Firearm Counseling: not applicable     Smoke Detectors: yes     Smoke Detector Counseling: no     Violence in the Home: no risk noted     Violence Counseling: not indicated; no violence risk noted     Fall Risk Counseling: not indicated; no significant falls noted  Clinical Review Panels:  Prevention   Last  Mammogram:  normal (04/24/2007)   Last Pap Smear:  normal (07/23/2007)  Immunizations   Last Tetanus Booster:  Td (04/23/2005)  Lipid Management   Cholesterol:  161 (06/03/2009)   LDL (bad choesterol):  82 (06/03/2009)   HDL (good cholesterol):  72.20 (06/03/2009)  CBC   WBC:  6.3 (06/03/2009)   RBC:  4.06 (06/03/2009)   Hgb:  13.2 (06/03/2009)   Hct:  39.4 (06/03/2009)   Platelets:  234.0 (06/03/2009)   MCV  97.0 (06/03/2009)   MCHC  33.5 (06/03/2009)   RDW  12.3 (06/03/2009)   PMN:  68.0 (06/03/2009)   Lymphs:  20.8 (06/03/2009)   Monos:  7.5 (06/03/2009)   Eosinophils:  1.0 (06/03/2009)   Basophil:  2.7 (06/03/2009)  Complete Metabolic Panel   Glucose:  99 (06/03/2009)   Sodium:  138 (06/03/2009)   Potassium:  4.6 (06/03/2009)   Chloride:  102 (06/03/2009)   CO2:  26 (06/03/2009)   BUN:  10 (06/03/2009)   Creatinine:  0.9 (06/03/2009)   Albumin:  3.9 (06/03/2009)   Total Protein:  6.6 (06/03/2009)   Calcium:  9.3 (06/03/2009)   Total Bili:  0.4 (06/03/2009)   Alk Phos:  41 (06/03/2009)   SGPT (ALT):  20 (06/03/2009)   SGOT (AST):  21 (06/03/2009)   Current Medications (verified): 1)  Vitamin D3 1000 Unit Tabs (Cholecalciferol) .... 2 Tabs By Mouth Once Daily 2)  Adderall 30 Mg Tabs (Amphetamine-Dextroamphetamine) .... Take 1 By Mouth Once Daily 3)  Prozac 20 Mg Caps (Fluoxetine Hcl) .... Take 1 By Mouth Once Daily  Allergies (verified): No Known Drug Allergies  Past History:  Past medical, surgical, family and social histories (including risk factors) reviewed, and no changes noted (except as noted below).  Past Medical History: closed head injury with concussion February 2010 - frontal lobe contusion Left L4-5 foraminal disc - MRI June 2010 - dr. Danielle Dess depression ADD   Md roster:  Past Surgical History: Reviewed history from 10/21/2008 and no changes required. Cholecystectomy (1990) Breast biopsy (2000)  Family History: Reviewed history from  10/21/2008 and no changes required. Family History Hypertension (parent,grandparents,other blood relative) Family History of Arthritis (parents) Family History Osteoporosis  Social History: Reviewed history from 06/03/2009 and no changes required. 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 I have reviewed all other systems and they were negative.   Physical Exam  General:  alert, well-developed, well-nourished, and cooperative to examination.    Head:  Normocephalic and atraumatic without obvious abnormalities. No apparent alopecia or balding. Eyes:  vision grossly intact; pupils equal, round and reactive to light.  conjunctiva and lids normal.    Ears:  normal pinnae bilaterally, without erythema, swelling, or tenderness to palpation. TMs clear, without effusion, or cerumen impaction. Hearing grossly normal bilaterally  Mouth:  teeth and gums in good repair; mucous membranes dry but without lesions or ulcers. oropharynx clear without exudate or erythema.  Neck:  supple, full ROM, no masses, no thyromegaly; no thyroid nodules or tenderness. no JVD or carotid bruits.   Lungs:  normal respiratory effort, no intercostal retractions or use of accessory muscles; normal breath sounds bilaterally - no crackles and no wheezes.    Heart:  normal rate, regular rhythm, no murmur, and no rub. BLE without edema. Abdomen:  soft, non-tender, normal bowel sounds, no distention; no masses and no appreciable hepatomegaly or splenomegaly.   Genitalia:  defer to gyn Msk:  No deformity or scoliosis noted of thoracic or lumbar spine.  myofascial tenderness left cervical region and scapular trigger points Neurologic:  alert & oriented X3 and cranial nerves II-XII symetrically intact.  strength normal in all  extremities, sensation intact to light touch, and gait normal. speech fluent without dysarthria or aphasia; follows commands with good comprehension.  Skin:  no rashes, vesicles, ulcers, or erythema. No nodules or irregularity to palpation.  Psych:  Oriented X3, memory intact for recent and remote, normally interactive, good eye contact, not anxious appearing, not depressed appearing, and not agitated.      Impression & Recommendations:  Problem # 1:  PREVENTIVE HEALTH CARE (ICD-V70.0) Patient has been counseled on age-appropriate routine health concerns for screening and prevention. These are reviewed and up-to-date. Immunizations are up-to-date or declined. Labs ordered, will be reviewed. refer to gyn for PAP/pelvic and ?perimenopause Orders: TLB-BMP (Basic Metabolic Panel-BMET) (80048-METABOL) TLB-Lipid Panel (80061-LIPID) TLB-CBC Platelet - w/Differential (85025-CBCD) TLB-Hepatic/Liver Function Pnl (80076-HEPATIC) TLB-TSH (Thyroid Stimulating Hormone) (52841-LKG) Gynecologic Referral (Gyn)  Problem # 2:  PERIMENOPAUSAL SYNDROME (ICD-627.9)  Orders: TLB-Luteinizing Hormone (LH) (83002-LH) TLB-FSH (Follicle Stimulating Hormone) (83001-FSH)  Problem # 3:  CERVICALGIA (ICD-723.1)  suspect muscle spasm from stress/strain - cont robaxin as needed as ongoing cont massage as needed and refer to PT to eval and tx same Orders: Physical Therapy Referral (PT)  Her updated medication list for this problem includes:    Robaxin-750 750 Mg Tabs (Methocarbamol) .Marland Kitchen... 1 by mouth three times a day as needed for muscle spasm  Complete Medication List: 1)  Vitamin D3 1000 Unit Tabs (Cholecalciferol) .... 2 tabs by mouth once daily 2)  Adderall 30 Mg Tabs (Amphetamine-dextroamphetamine) .... Take 1 by mouth once daily 3)  Prozac 20 Mg Caps (Fluoxetine hcl) .... Take 1 by mouth once daily 4)  Robaxin-750 750 Mg Tabs (Methocarbamol) .Marland Kitchen.. 1 by mouth three times a day as needed for muscle  spasm  Patient Instructions: 1)  it was good to see you today. 2)  no medication changes today 3)  test(s) ordered today - your results will be called to you after review in 48-72 hours from the time of test completion 4)  we'll make referral to gynecology for PAP/pelvic. Our office will contact you regarding this appointment once made.  5)  also refer to PT and massage as discussed for your neck pain 6)  Please schedule a follow-up appointment in 3-6 months to review symptoms, call sooner if problems.    Orders Added: 1)  TLB-BMP (Basic Metabolic Panel-BMET) [80048-METABOL] 2)  TLB-Lipid Panel [80061-LIPID] 3)  TLB-CBC Platelet - w/Differential [85025-CBCD] 4)  TLB-Hepatic/Liver Function Pnl [80076-HEPATIC] 5)  TLB-TSH (Thyroid Stimulating Hormone) [84443-TSH] 6)  TLB-Luteinizing Hormone (LH) [83002-LH] 7)  TLB-FSH (Follicle Stimulating Hormone) [83001-FSH] 8)  Physical Therapy Referral [PT] 9)  Est. Patient 40-64 years [16109] 10)  Gynecologic Referral [Gyn]

## 2010-08-07 ENCOUNTER — Other Ambulatory Visit: Payer: Self-pay | Admitting: Internal Medicine

## 2010-08-07 DIAGNOSIS — K219 Gastro-esophageal reflux disease without esophagitis: Secondary | ICD-10-CM

## 2010-08-07 MED ORDER — ESOMEPRAZOLE MAGNESIUM 40 MG PO CPDR: 40.0000 mg | DELAYED_RELEASE_CAPSULE | Freq: Every day | ORAL | Status: DC

## 2010-08-07 NOTE — Telephone Encounter (Addendum)
Will send rx for nexium as other OTC meds (H2B and PPI) ineffective - call if continued problems or is symptoms worse- thanks i have entered pharmacy and erx done -i notified pt of same

## 2010-08-07 NOTE — Telephone Encounter (Signed)
Addended by: Rene Paci ANN on: 08/07/2010 02:18 PM   Modules accepted: Orders

## 2010-08-07 NOTE — Telephone Encounter (Signed)
Pt left message on triage requesting an rx for her acid reflux symptoms be called into her pharmacy. Per pt, OTC meds have not helped with sxs-please advise.

## 2010-08-17 ENCOUNTER — Telehealth: Payer: Self-pay | Admitting: Internal Medicine

## 2010-08-17 MED ORDER — MELOXICAM 15 MG PO TABS: 15.0000 mg | ORAL_TABLET | Freq: Every day | ORAL | Status: DC

## 2010-08-17 NOTE — Telephone Encounter (Signed)
the patient called with indigestion symptoms, improved on nexium but exac by ibuprofen. No abdominal pain or nausea and vomiting. No bowel changes. Will rx meloxicam in place of ibuprofen use at this time - erx done - pt agrees to call if continued or worsening symptoms -

## 2010-08-25 ENCOUNTER — Other Ambulatory Visit: Payer: Self-pay

## 2010-08-25 MED ORDER — AMPHETAMINE-DEXTROAMPHETAMINE 30 MG PO TABS: 30.0000 mg | ORAL_TABLET | Freq: Every day | ORAL | Status: DC

## 2010-08-25 NOTE — Telephone Encounter (Signed)
Pt informed, Rx in cabinet for pt pick up  

## 2010-08-25 NOTE — Telephone Encounter (Signed)
Pt called requesting refill of Adderall. Rx was changed from Vyvanse to Adderall 30mg  last OV 06/30/2010

## 2010-09-08 NOTE — Op Note (Signed)
[facility]  Patient:    Mariah Rodriguez, DECATUR Visit Number: [id number] MRN: [id number]          Service Type: DSU Location: [facility] Attending Physician:  Vikki Ports Dictated by:   Earna Coder, M.D. Proc. Date: 05/06/01 Admit Date:  05/06/2001                             Operative Report  PREOPERATIVE DIAGNOSIS: 1. Left breast mass. 2. Right shoulder lipoma. 3. Right arm lipoma.  POSTOPERATIVE DIAGNOSIS: 1. Left breast mass. 2. Right shoulder lipoma. 3. Right arm lipoma.  OPERATION PERFORMED: 1. Left breast biopsy. 2. Excision of right shoulder lipoma. 3. Excision of right arm lipoma.  SURGEON:  Stephenie Acres, M.D.  ANESTHESIA:  Local MAC.  DESCRIPTION OF PROCEDURE:  The patient was taken to the operating room and placed in supine position.  After adequate anesthesia was induced using MAC technique, the right arm was prepped and draped in normal sterile fashion. Using 1% lidocaine local anesthesia with bicarb the skin and subcutaneous tissue was anesthetized just proximal to the antecubital fossa.  A vertical incision was made and dissected down into well encapsulated fatty tumor. Adhesions were taken down.  It was delivered up into the wound.  Adequate hemostasis was ensured and the skin was was closed with subcuticular 4-0 Monocryl.  I then turned my attention to the right posterior shoulder.  This was prepped and draped in the standard fashion.  Again using 1% lidocaine local anesthesia, the subcutaneous tissue was anesthetized.  An axial incision was made over the lobulated mass, dissected down into a well encapsulated fatty tumor. This was delivered up into the wound.  Adequate hemostasis was ensured. It was excised in its entirety.  Skin was closed with subcuticular 4-0 Monocryl.  Steri-Strips were applied.  I then turned my attention to the left breast.  It was prepped and draped in normal sterile  fashion.  Using 1% lidocaine local anesthesia, the skin and subcutaneous tissue was anesthetized extending from 11 to 12 oclock in the periareolar region.  A curvilinear incision was made, dissected down onto what appeared to be normal fatty breast tissue; however, deep to this was thick dense fibrotic mass which was excised in its entirety and sent for pathologic evaluation.  Adequate hemostasis was ensured and the skin was closed with subcuticular 4-0 Monocryl.  Steri-Strips and sterile dressings were applied. The patient tolerated the procedure well and went to PACU in good condition. Dictated by:   Earna Coder, M.D. Attending Physician:  Danna Hefty R. DD:  05/06/01 TD:  05/06/01 Job: [id number]

## 2010-10-10 ENCOUNTER — Encounter: Payer: Self-pay | Admitting: Internal Medicine

## 2010-10-16 ENCOUNTER — Other Ambulatory Visit: Payer: Self-pay

## 2010-10-16 MED ORDER — AMPHETAMINE-DEXTROAMPHETAMINE 30 MG PO TABS: 30.0000 mg | ORAL_TABLET | Freq: Every day | ORAL | Status: DC

## 2010-10-16 NOTE — Telephone Encounter (Signed)
Pt informed, Rx in cabinet for pt pick up  

## 2010-10-23 ENCOUNTER — Ambulatory Visit (INDEPENDENT_AMBULATORY_CARE_PROVIDER_SITE_OTHER): Payer: BC Managed Care – PPO | Admitting: Internal Medicine

## 2010-10-23 ENCOUNTER — Encounter: Payer: Self-pay | Admitting: Internal Medicine

## 2010-10-23 VITALS — BP 102/64 | HR 76 | Temp 97.9°F | Ht 64.0 in | Wt 118.4 lb

## 2010-10-23 DIAGNOSIS — K219 Gastro-esophageal reflux disease without esophagitis: Secondary | ICD-10-CM | POA: Insufficient documentation

## 2010-10-23 DIAGNOSIS — M1990 Unspecified osteoarthritis, unspecified site: Secondary | ICD-10-CM

## 2010-10-23 DIAGNOSIS — K2100 Gastro-esophageal reflux disease with esophagitis, without bleeding: Secondary | ICD-10-CM

## 2010-10-23 MED ORDER — TRAMADOL HCL 50 MG PO TABS: 50.0000 mg | ORAL_TABLET | Freq: Four times a day (QID) | ORAL | Status: AC | PRN

## 2010-10-23 MED ORDER — RABEPRAZOLE SODIUM 20 MG PO TBEC: 20.0000 mg | DELAYED_RELEASE_TABLET | Freq: Every day | ORAL | Status: DC

## 2010-10-23 NOTE — Patient Instructions (Addendum)
It was good to see you today. We have reviewed your prior records including labs and tests today Use aciphex or any other PPI "every other day" or as instructed - Your prescription(s) have been submitted to your pharmacy. Please take as directed and contact our office if you believe you are having problem(s) with the medication(s). Ok to use Pepcid daily as discussed Elevate head of bed, avoid large meals, mints and other foods that aggravate your reflux symptoms- If still unimproved, call for refer to GI  Try tramadol for arthritis - avoid "NSAIDs" until stomach symptoms improved - Your prescription(s) have been submitted to your pharmacy. Please take as directed and contact our office if you believe you are having problem(s) with the medication(s).

## 2010-10-23 NOTE — Progress Notes (Signed)
  Subjective:     Mariah Rodriguez is an 50 y.o. female who presents for evaluation of heartburn. This has been associated with cough, heartburn, nausea, need to clear throat frequently, nocturnal burning and symptoms primarily relate to meals, and lying down after meals. She denies abdominal bloating, difficulty swallowing, laryngitis, regurgitation of undigested food, shortness of breath and unexpected weight loss. Symptoms have been present for 4 months. She denies dysphagia. She has not lost weight. She denies melena, hematochezia, hematemesis, and coffee ground emesis. Medical therapy in the past has included: antacids, H2 antagonists and proton pump inhibitors.  The following portions of the patient's history were reviewed and updated as appropriate: allergies, current medications, past family history, past medical history, past social history, past surgical history and problem list.  Review of Systems Constitutional: negative for night sweats Cardiovascular: negative for chest pain, irregular heart beat and lower extremity edema Gastrointestinal: negative for change in bowel habits and diarrhea   Objective:     BP 102/64  Pulse 76  Temp(Src) 97.9 F (36.6 C) (Oral)  Ht 5\' 4"  (1.626 m)  Wt 118 lb 6.4 oz (53.706 kg)  BMI 20.32 kg/m2  SpO2 98% General appearance: alert, cooperative and no distress Lungs: clear to auscultation bilaterally Heart: regular rate and rhythm, S1, S2 normal, no murmur, click, rub or gallop Abdomen: soft, non-tender; bowel sounds normal; no masses,  no organomegaly   Assessment:    Gastroesophageal Reflux Disease    Arthritis - hands  Plan:    Nonpharmacologic treatments were discussed including: eating smaller meals, elevation of the head of bed at night, avoidance of caffeine, chocolate, nicotine and peppermint, and avoiding tight fitting clothing.  Start new PPI, advised against daily use to avoid rebound reflux Avoid NSAIDs including topical until GI  symptoms improved - new erx for tramadol provided

## 2010-11-20 ENCOUNTER — Other Ambulatory Visit: Payer: Self-pay

## 2010-11-20 DIAGNOSIS — R05 Cough: Secondary | ICD-10-CM

## 2010-11-20 MED ORDER — AMPHETAMINE-DEXTROAMPHETAMINE 30 MG PO TABS: 30.0000 mg | ORAL_TABLET | Freq: Every day | ORAL | Status: DC

## 2010-11-20 NOTE — Telephone Encounter (Signed)
Pt also states she has developed a cough due to reflux. Pt is taking all medications as advised last OV but is requesting MD advise on cough.

## 2010-11-20 NOTE — Telephone Encounter (Signed)
Have pt come to office for CXR (order done) - then start claritin daily and tessalon - erx sent to use prn cough symptoms

## 2010-11-21 MED ORDER — BENZONATATE 100 MG PO CAPS: 100.0000 mg | ORAL_CAPSULE | Freq: Three times a day (TID) | ORAL | Status: DC | PRN

## 2010-11-21 MED ORDER — LORATADINE 10 MG PO TABS: 10.0000 mg | ORAL_TABLET | Freq: Every day | ORAL | Status: DC | PRN

## 2010-11-21 NOTE — Telephone Encounter (Signed)
Pt advised of both Rxs and CXR. Pt advised to call back as needed.

## 2010-11-21 NOTE — Telephone Encounter (Signed)
Left message on machine for pt to return my call  

## 2010-12-26 ENCOUNTER — Other Ambulatory Visit: Payer: Self-pay

## 2010-12-26 MED ORDER — AMPHETAMINE-DEXTROAMPHETAMINE 30 MG PO TABS: 30.0000 mg | ORAL_TABLET | Freq: Every day | ORAL | Status: DC

## 2010-12-26 NOTE — Telephone Encounter (Signed)
Called pt no answer LMOM rx ready for pick-up...12/26/10@2 :06pm/LMB

## 2011-01-15 ENCOUNTER — Ambulatory Visit (INDEPENDENT_AMBULATORY_CARE_PROVIDER_SITE_OTHER): Payer: BC Managed Care – PPO | Admitting: Internal Medicine

## 2011-01-15 ENCOUNTER — Encounter: Payer: Self-pay | Admitting: Internal Medicine

## 2011-01-15 VITALS — BP 110/68 | HR 110 | Temp 98.6°F | Ht 64.0 in | Wt 121.4 lb

## 2011-01-15 DIAGNOSIS — K219 Gastro-esophageal reflux disease without esophagitis: Secondary | ICD-10-CM

## 2011-01-15 DIAGNOSIS — L659 Nonscarring hair loss, unspecified: Secondary | ICD-10-CM

## 2011-01-15 MED ORDER — LANSOPRAZOLE 15 MG PO CPDR: 15.0000 mg | DELAYED_RELEASE_CAPSULE | Freq: Every day | ORAL | Status: DC

## 2011-01-15 NOTE — Patient Instructions (Signed)
It was good to see you today. Change prilosec to prevacid and remember to eat frequent small meals Will look for the labs from gynecology to review and consider need for dermatology evaluation if needed

## 2011-01-15 NOTE — Progress Notes (Signed)
  Subjective:    Patient ID: Mariah Rodriguez, female    DOB: 1961-03-28, 50 y.o.   MRN: [id number]  HPI Here for follow up -    GERD - continue symptoms - daily indigestion Seen here for same 10/2010 Various OTC and rx PPI used without relief - aciphex, nexium, omeprazole and pepcid ac No weight changes or bowel changes  Also complains of scalp hair loss No other body area affected Noted by hairdresser No scalp pain or rash/lesions/itch  Past Medical History  Diagnosis Date  . 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 DEPRESSION   . ADD   . PERIMENOPAUSAL SYNDROME   . Unspecified vitamin D deficiency   . Cervicalgia   . GERD (gastroesophageal reflux disease)     Review of Systems  HENT: Negative for neck pain.   Respiratory: Negative for choking and wheezing.   Cardiovascular: Negative for palpitations.  Neurological: Negative for headaches.       Objective:   Physical Exam BP 110/68  Pulse 110  Temp(Src) 98.6 F (37 C) (Oral)  Ht 5\' 4"  (1.626 m)  Wt 121 lb 6.4 oz (55.067 kg)  BMI 20.84 kg/m2  SpO2 98%  Wt Readings from Last 3 Encounters:  01/15/11 121 lb 6.4 oz (55.067 kg)  10/23/10 118 lb 6.4 oz (53.706 kg)  06/30/10 120 lb 6.4 oz (54.613 kg)   Constitutional: She is fit, well-developed and well-nourished. No distress. Eyes: Conjunctivae and EOM are normal. Pupils are equal, round, and reactive to light. No scleral icterus.  Neck: Normal range of motion. Neck supple. No JVD present. No thyromegaly present.  Cardiovascular: Normal rate, regular rhythm and normal heart sounds.  No murmur heard. No BLE edema. Pulmonary/Chest: Effort normal and breath sounds normal. No respiratory distress. She has no wheezes.  Abdominal: Soft. Bowel sounds are normal. She exhibits no distension. There is no tenderness. no masses Skin: mild bi-temporal with short growth, no alopecia - skin is warm and dry. No rash noted. No erythema.  Psychiatric: She  has a normal mood and affect. Her behavior is normal. Judgment and thought content normal.   Lab Results  Component Value Date   WBC 7.0 06/30/2010   HGB 13.5 06/30/2010   HCT 39.2 06/30/2010   PLT 236.0 06/30/2010   CHOL 152 06/30/2010   TRIG 35.0 06/30/2010   HDL 65.60 06/30/2010   ALT 17 06/30/2010   AST 16 06/30/2010   NA 138 06/30/2010   K 4.5 06/30/2010   CL 102 06/30/2010   CREATININE 0.8 06/30/2010   BUN 10 06/30/2010   CO2 29 06/30/2010   TSH 1.06 06/30/2010      Assessment & Plan:  See problem list. Medications and labs reviewed today.  Also hair loss: appears androgenic pattern - follow up labs as ordered by gyn last week; consider derm per request after review

## 2011-01-15 NOTE — Assessment & Plan Note (Signed)
Various PPI trials ineffective (nexium, aciphex, nexium) - prefers OTC due to $ Change to prevacid and consider need for GI eval Reminded of need for small but frequent meals

## 2011-02-22 ENCOUNTER — Other Ambulatory Visit: Payer: Self-pay

## 2011-02-23 ENCOUNTER — Telehealth: Payer: Self-pay | Admitting: *Deleted

## 2011-02-23 MED ORDER — AMPHETAMINE-DEXTROAMPHETAMINE 30 MG PO TABS: 30.0000 mg | ORAL_TABLET | Freq: Every day | ORAL | Status: DC

## 2011-02-23 NOTE — Telephone Encounter (Signed)
Called pt to let her know adderall rx ready for pick-up...02/23/11@1 :46pm/LMB

## 2011-02-23 NOTE — Telephone Encounter (Signed)
Rx faxed to pharmacy  

## 2011-03-29 ENCOUNTER — Telehealth: Payer: Self-pay | Admitting: *Deleted

## 2011-03-29 MED ORDER — AMPHETAMINE-DEXTROAMPHETAMINE 30 MG PO TABS: 30.0000 mg | ORAL_TABLET | Freq: Every day | ORAL | Status: DC

## 2011-03-29 NOTE — Telephone Encounter (Signed)
Okay to refill and I will sign

## 2011-03-29 NOTE — Telephone Encounter (Signed)
Left msg on vm needing refill on Adderal....03/29/11@10 :05am/LMB

## 2011-03-29 NOTE — Telephone Encounter (Signed)
Notified pt rx ready for pick-up...03/29/11@2 :14pm/LMB

## 2011-05-24 ENCOUNTER — Other Ambulatory Visit: Payer: Self-pay

## 2011-05-25 MED ORDER — AMPHETAMINE-DEXTROAMPHETAMINE 30 MG PO TABS: 30.0000 mg | ORAL_TABLET | Freq: Every day | ORAL | Status: DC

## 2011-05-25 NOTE — Telephone Encounter (Signed)
Pt informed, Rx in cabinet for pt pick up  

## 2011-07-06 ENCOUNTER — Other Ambulatory Visit: Payer: Self-pay

## 2011-07-06 MED ORDER — AMPHETAMINE-DEXTROAMPHETAMINE 30 MG PO TABS: 30.0000 mg | ORAL_TABLET | Freq: Every day | ORAL | Status: DC

## 2011-07-06 NOTE — Telephone Encounter (Signed)
Pt informed, Rx in cabinet for pt pick up  

## 2011-08-23 ENCOUNTER — Other Ambulatory Visit: Payer: Self-pay

## 2011-08-23 MED ORDER — AMPHETAMINE-DEXTROAMPHETAMINE 30 MG PO TABS: 30.0000 mg | ORAL_TABLET | Freq: Every day | ORAL | Status: DC

## 2011-08-23 NOTE — Telephone Encounter (Signed)
Pt informed, Rx in cabinet for pt pick up  

## 2011-10-08 ENCOUNTER — Other Ambulatory Visit: Payer: Self-pay | Admitting: *Deleted

## 2011-10-08 MED ORDER — AMPHETAMINE-DEXTROAMPHETAMINE 30 MG PO TABS: 30.0000 mg | ORAL_TABLET | Freq: Every day | ORAL | Status: DC

## 2011-10-08 NOTE — Telephone Encounter (Signed)
Pt called requesting refill of Adderall-last written 08/23/2011- #30 with 0 refills

## 2011-10-08 NOTE — Telephone Encounter (Signed)
ok 

## 2011-10-08 NOTE — Telephone Encounter (Signed)
Pt informed, rx placed upfront in cabinet.  

## 2011-12-07 ENCOUNTER — Other Ambulatory Visit: Payer: Self-pay

## 2011-12-07 MED ORDER — AMPHETAMINE-DEXTROAMPHETAMINE 30 MG PO TABS: 30.0000 mg | ORAL_TABLET | Freq: Every day | ORAL | Status: DC

## 2011-12-07 NOTE — Telephone Encounter (Signed)
Pt informed, Rx in cabinet for pt pick up  

## 2012-05-17 ENCOUNTER — Encounter (HOSPITAL_COMMUNITY): Payer: Self-pay | Admitting: Emergency Medicine

## 2012-05-17 ENCOUNTER — Emergency Department (HOSPITAL_COMMUNITY)
Admission: EM | Admit: 2012-05-17 | Discharge: 2012-05-17 | Disposition: A | Discharge status: Home / Self Care | Payer: BC Managed Care – PPO | Attending: Emergency Medicine | Admitting: Emergency Medicine

## 2012-05-17 DIAGNOSIS — Z8659 Personal history of other mental and behavioral disorders: Secondary | ICD-10-CM | POA: Insufficient documentation

## 2012-05-17 DIAGNOSIS — Z8739 Personal history of other diseases of the musculoskeletal system and connective tissue: Secondary | ICD-10-CM | POA: Insufficient documentation

## 2012-05-17 DIAGNOSIS — Z8742 Personal history of other diseases of the female genital tract: Secondary | ICD-10-CM | POA: Insufficient documentation

## 2012-05-17 DIAGNOSIS — Z8639 Personal history of other endocrine, nutritional and metabolic disease: Secondary | ICD-10-CM | POA: Insufficient documentation

## 2012-05-17 DIAGNOSIS — K219 Gastro-esophageal reflux disease without esophagitis: Secondary | ICD-10-CM | POA: Insufficient documentation

## 2012-05-17 DIAGNOSIS — Z862 Personal history of diseases of the blood and blood-forming organs and certain disorders involving the immune mechanism: Secondary | ICD-10-CM | POA: Insufficient documentation

## 2012-05-17 DIAGNOSIS — Z79899 Other long term (current) drug therapy: Secondary | ICD-10-CM | POA: Insufficient documentation

## 2012-05-17 DIAGNOSIS — T148XXA Other injury of unspecified body region, initial encounter: Secondary | ICD-10-CM

## 2012-05-17 DIAGNOSIS — W540XXA Bitten by dog, initial encounter: Secondary | ICD-10-CM | POA: Insufficient documentation

## 2012-05-17 DIAGNOSIS — Z23 Encounter for immunization: Secondary | ICD-10-CM | POA: Insufficient documentation

## 2012-05-17 DIAGNOSIS — Y929 Unspecified place or not applicable: Secondary | ICD-10-CM | POA: Insufficient documentation

## 2012-05-17 DIAGNOSIS — Y9389 Activity, other specified: Secondary | ICD-10-CM | POA: Insufficient documentation

## 2012-05-17 DIAGNOSIS — S01501A Unspecified open wound of lip, initial encounter: Secondary | ICD-10-CM | POA: Insufficient documentation

## 2012-05-17 MED ORDER — AMOXICILLIN-POT CLAVULANATE 875-125 MG PO TABS: 1.0000 | ORAL_TABLET | Freq: Two times a day (BID) | ORAL | Status: DC

## 2012-05-17 MED ORDER — TETANUS-DIPHTH-ACELL PERTUSSIS 5-2.5-18.5 LF-MCG/0.5 IM SUSP
0.5000 mL | Freq: Once | INTRAMUSCULAR | Status: AC
  Administered 2012-05-17: 0.5 mL via INTRAMUSCULAR
  Filled 2012-05-17: qty 0.5

## 2012-05-17 NOTE — ED Notes (Signed)
Patient complaining of dog bite to lip -- was bit by her own 12-week lab.  Dog is up to date on vaccinations, but has not received rabies vaccination due to age.  Laceration noted above left side of upper lip.  Laceration noted bleeding at this time.

## 2012-05-17 NOTE — ED Provider Notes (Signed)
History     CSN: [id number]  Arrival date & time 05/17/12  1909   First MD Initiated Contact with Patient 05/17/12 2006      Chief Complaint  Patient presents with  . Animal Bite    (Consider location/radiation/quality/duration/timing/severity/associated sxs/prior treatment) HPI History provided by pt.   Pt was bitten on right side of upper lip by her 19 week old puppy.  Puppy is too young for rabies vaccinations.  Pain is minimal and bleeding is controlled.  Tetanus is out of date.   Past Medical History  Diagnosis Date  . 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 DEPRESSION   . ADD   . PERIMENOPAUSAL SYNDROME   . Unspecified vitamin D deficiency   . Cervicalgia   . GERD (gastroesophageal reflux disease)     Past Surgical History  Procedure Date  . Cholecystectomy   . Breast surgery 2000    Breast biopsy    Family History  Problem Relation Age of Onset  . Hypertension Other     parent, grandparent, other relative  . Arthritis Other     Parent  . Osteoporosis Other     History  Substance Use Topics  . Smoking status: Never Smoker   . 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 Alcohol Use: Yes     Comment: Social    OB History    Grav Para Term Preterm Abortions TAB SAB Ect Mult Living                  Review of Systems  All other systems reviewed and are negative.    Allergies  Review of patient's allergies indicates no known allergies.  Home Medications   Current Outpatient Rx  Name  Route  Sig  Dispense  Refill  . AMPHETAMINE-DEXTROAMPHETAMINE 30 MG PO TABS   Oral   Take 1 tablet (30 mg total) by mouth daily.   30 tablet   0   . VITAMIN D 1000 UNITS PO TABS   Oral   Take 1,000 Units by mouth daily.           Marland Kitchen LANSOPRAZOLE 15 MG PO CPDR   Oral   Take 15 mg by mouth daily as needed. For acid reflux         . LORATADINE 10 MG PO  TABS   Oral   Take 10 mg by mouth daily as needed. For allergy symptoms         . TRAZODONE HCL 100 MG PO TABS   Oral   Take 100 mg by mouth at bedtime as needed. For sleep         . AMOXICILLIN-POT CLAVULANATE 875-125 MG PO TABS   Oral   Take 1 tablet by mouth every 12 (twelve) hours.   14 tablet   0     BP 98/53  Pulse 69  Temp 98.4 F (36.9 C) (Oral)  Resp 16  SpO2 98%  Physical Exam  Nursing note and vitals reviewed. Constitutional: She is oriented to person, place, and time. She appears well-developed and well-nourished. No distress.  HENT:  Head: Normocephalic and atraumatic.       2cm vertical scratch at right upper lip, middle 1cm open to subq tissue and oozing blood.  Edges well approximated.    Eyes:       Normal appearance  Neck: Normal range of motion.  Pulmonary/Chest: Effort normal.  Musculoskeletal: Normal range of motion.  Neurological: She is alert and oriented to person, place, and time.  Psychiatric: She has a normal mood and affect. Her behavior is normal.    ED Course  Procedures (including critical care time)  Labs Reviewed - No data to display No results found.   1. Animal bite       MDM  Patient's 12wk old, unvaccinated puppy, bit her on right upper lip this evening.  Has a 2cm scratch, the middle 1cm of which is open to depth of subq.  Edges well approximated.  Patient is friends w/ a Investment banker, corporate who told her to have wound closed.  I explained to her that the risks outweight benefits, particularly based on the appearance of her wound.  I gave her the option and she is comfortable w/ leaving it open.  Nursing staff cleaned and applied a single steri-strip.  Tetanus updated.  D/c'd home w/ augmentin.  She is aware that dog will need to be monitored for signs of rabies for the next three days.  Return precautions discussed.  8:45 PM       Otilio Miu, PA-C 05/17/12 2045

## 2012-05-17 NOTE — ED Provider Notes (Signed)
Medical screening examination/treatment/procedure(s) were performed by non-physician practitioner and as supervising physician I was immediately available for consultation/collaboration.   Ashawnti Tangen, MD 05/17/12 [id number] 

## 2012-06-19 ENCOUNTER — Other Ambulatory Visit: Payer: Self-pay | Admitting: Internal Medicine

## 2012-06-19 MED ORDER — AMPHETAMINE-DEXTROAMPHETAMINE 30 MG PO TABS: 30.0000 mg | ORAL_TABLET | Freq: Every day | ORAL | Status: DC

## 2012-06-19 NOTE — Telephone Encounter (Signed)
Ok - and please ask pt to schedule CPX/labs as no OV in >12 mo - thanks

## 2012-06-19 NOTE — Telephone Encounter (Signed)
The pt called and is hoping to get a refill of Adderall 30mg .  Her callback is [phone]

## 2012-06-20 NOTE — Telephone Encounter (Signed)
Called pt no answer left msg on cell # with md response...lmb

## 2012-07-22 ENCOUNTER — Encounter: Payer: BC Managed Care – PPO | Admitting: Internal Medicine

## 2012-07-23 ENCOUNTER — Encounter: Payer: Self-pay | Admitting: Internal Medicine

## 2012-07-23 ENCOUNTER — Ambulatory Visit (INDEPENDENT_AMBULATORY_CARE_PROVIDER_SITE_OTHER): Payer: BC Managed Care – PPO | Admitting: Internal Medicine

## 2012-07-23 VITALS — BP 98/64 | HR 76 | Temp 97.9°F | Resp 14 | Ht 63.0 in | Wt 122.0 lb

## 2012-07-23 DIAGNOSIS — Z1239 Encounter for other screening for malignant neoplasm of breast: Secondary | ICD-10-CM

## 2012-07-23 DIAGNOSIS — Z0000 Encounter for general adult medical examination without abnormal findings: Secondary | ICD-10-CM

## 2012-07-23 DIAGNOSIS — F329 Major depressive disorder, single episode, unspecified: Secondary | ICD-10-CM

## 2012-07-23 DIAGNOSIS — Z1211 Encounter for screening for malignant neoplasm of colon: Secondary | ICD-10-CM

## 2012-07-23 DIAGNOSIS — M6580 Other synovitis and tenosynovitis, unspecified site: Secondary | ICD-10-CM

## 2012-07-23 DIAGNOSIS — R413 Other amnesia: Secondary | ICD-10-CM

## 2012-07-23 DIAGNOSIS — F3289 Other specified depressive episodes: Secondary | ICD-10-CM

## 2012-07-23 DIAGNOSIS — R259 Unspecified abnormal involuntary movements: Secondary | ICD-10-CM

## 2012-07-23 DIAGNOSIS — R251 Tremor, unspecified: Secondary | ICD-10-CM

## 2012-07-23 DIAGNOSIS — R0789 Other chest pain: Secondary | ICD-10-CM

## 2012-07-23 DIAGNOSIS — IMO0002 Reserved for concepts with insufficient information to code with codable children: Secondary | ICD-10-CM

## 2012-07-23 MED ORDER — FAMOTIDINE 40 MG PO TABS: 40.0000 mg | ORAL_TABLET | Freq: Every day | ORAL | Status: DC

## 2012-07-23 MED ORDER — CLONAZEPAM 0.5 MG PO TABS: 0.5000 mg | ORAL_TABLET | Freq: Two times a day (BID) | ORAL | Status: DC | PRN

## 2012-07-23 MED ORDER — CELECOXIB 200 MG PO CAPS: 200.0000 mg | ORAL_CAPSULE | Freq: Two times a day (BID) | ORAL | Status: DC

## 2012-07-23 MED ORDER — AMPHETAMINE-DEXTROAMPHETAMINE 30 MG PO TABS: 30.0000 mg | ORAL_TABLET | Freq: Every day | ORAL | Status: DC

## 2012-07-23 NOTE — Patient Instructions (Signed)
It was good to see you today. Health Maintenance reviewed - all recommended immunizations and age-appropriate screenings are up-to-date. We'll make referrals for your mammogram and colonoscopy screening as discussed. My office will call regarding these appointment(s) Test(s) ordered today. Your results will be released to MyChart (or called to you) after review, usually within 72hours after test completion. If any changes need to be made, you will be notified at that same time. Medications reviewed and updated, resume clonazepam as needed for anxiety symptoms and Celebrex for tendonitis pain Your prescription(s) have been submitted to your pharmacy. Please take as directed and contact our office if you believe you are having problem(s) with the medication(s). we'll make referral to counseling for depression support and to neurology for evaluation of memory and behavior changes . Our office will contact you regarding appointment(s) once made. followup in 6 weeks to review, please call sooner if problems  Health Maintenance, Females A healthy lifestyle and preventative care can promote health and wellness.  Maintain regular health, dental, and eye exams.  Eat a healthy diet. Foods like vegetables, fruits, whole grains, low-fat dairy products, and lean protein foods contain the nutrients you need without too many calories. Decrease your intake of foods high in solid fats, added sugars, and salt. Get information about a proper diet from your caregiver, if necessary.  Regular physical exercise is one of the most important things you can do for your health. Most adults should get at least 150 minutes of moderate-intensity exercise (any activity that increases your heart rate and causes you to sweat) each week. In addition, most adults need muscle-strengthening exercises on 2 or more days a week.   Maintain a healthy weight. The body mass index (BMI) is a screening tool to identify possible weight  problems. It provides an estimate of body fat based on height and weight. Your caregiver can help determine your BMI, and can help you achieve or maintain a healthy weight. For adults 20 years and older:  A BMI below 18.5 is considered underweight.  A BMI of 18.5 to 24.9 is normal.  A BMI of 25 to 29.9 is considered overweight.  A BMI of 30 and above is considered obese.  Maintain normal blood lipids and cholesterol by exercising and minimizing your intake of saturated fat. Eat a balanced diet with plenty of fruits and vegetables. Blood tests for lipids and cholesterol should begin at age 32 and be repeated every 5 years. If your lipid or cholesterol levels are high, you are over 50, or you are a high risk for heart disease, you may need your cholesterol levels checked more frequently.Ongoing high lipid and cholesterol levels should be treated with medicines if diet and exercise are not effective.  If you smoke, find out from your caregiver how to quit. If you do not use tobacco, do not start.  If you are pregnant, do not drink alcohol. If you are breastfeeding, be very cautious about drinking alcohol. If you are not pregnant and choose to drink alcohol, do not exceed 1 drink per day. One drink is considered to be 12 ounces (355 mL) of beer, 5 ounces (148 mL) of wine, or 1.5 ounces (44 mL) of liquor.  Avoid use of street drugs. Do not share needles with anyone. Ask for help if you need support or instructions about stopping the use of drugs.  High blood pressure causes heart disease and increases the risk of stroke. Blood pressure should be checked at least every 1 to  2 years. Ongoing high blood pressure should be treated with medicines, if weight loss and exercise are not effective.  If you are 75 to 51 years old, ask your caregiver if you should take aspirin to prevent strokes.  Diabetes screening involves taking a blood sample to check your fasting blood sugar level. This should be done  once every 3 years, after age 62, if you are within normal weight and without risk factors for diabetes. Testing should be considered at a younger age or be carried out more frequently if you are overweight and have at least 1 risk factor for diabetes.  Breast cancer screening is essential preventative care for women. You should practice "breast self-awareness." This means understanding the normal appearance and feel of your breasts and may include breast self-examination. Any changes detected, no matter how small, should be reported to a caregiver. Women in their 67s and 30s should have a clinical breast exam (CBE) by a caregiver as part of a regular health exam every 1 to 3 years. After age 52, women should have a CBE every year. Starting at age 40, women should consider having a mammogram (breast X-ray) every year. Women who have a family history of breast cancer should talk to their caregiver about genetic screening. Women at a high risk of breast cancer should talk to their caregiver about having an MRI and a mammogram every year.  The Pap test is a screening test for cervical cancer. Women should have a Pap test starting at age 24. Between ages 47 and 81, Pap tests should be repeated every 2 years. Beginning at age 48, you should have a Pap test every 3 years as long as the past 3 Pap tests have been normal. If you had a hysterectomy for a problem that was not cancer or a condition that could lead to cancer, then you no longer need Pap tests. If you are between ages 68 and 85, and you have had normal Pap tests going back 10 years, you no longer need Pap tests. If you have had past treatment for cervical cancer or a condition that could lead to cancer, you need Pap tests and screening for cancer for at least 20 years after your treatment. If Pap tests have been discontinued, risk factors (such as a new sexual partner) need to be reassessed to determine if screening should be resumed. Some women have medical  problems that increase the chance of getting cervical cancer. In these cases, your caregiver may recommend more frequent screening and Pap tests.  The human papillomavirus (HPV) test is an additional test that may be used for cervical cancer screening. The HPV test looks for the virus that can cause the cell changes on the cervix. The cells collected during the Pap test can be tested for HPV. The HPV test could be used to screen women aged 2 years and older, and should be used in women of any age who have unclear Pap test results. After the age of 37, women should have HPV testing at the same frequency as a Pap test.  Colorectal cancer can be detected and often prevented. Most routine colorectal cancer screening begins at the age of 58 and continues through age 25. However, your caregiver may recommend screening at an earlier age if you have risk factors for colon cancer. On a yearly basis, your caregiver may provide home test kits to check for hidden blood in the stool. Use of a small camera at the end of a  tube, to directly examine the colon (sigmoidoscopy or colonoscopy), can detect the earliest forms of colorectal cancer. Talk to your caregiver about this at age 47, when routine screening begins. Direct examination of the colon should be repeated every 5 to 10 years through age 72, unless early forms of pre-cancerous polyps or small growths are found.  Hepatitis C blood testing is recommended for all people born from [birth year] through 1964 and any individual with known risks for hepatitis C.  Practice safe sex. Use condoms and avoid high-risk sexual practices to reduce the spread of sexually transmitted infections (STIs). Sexually active women aged 61 and younger should be checked for Chlamydia, which is a common sexually transmitted infection. Older women with new or multiple partners should also be tested for Chlamydia. Testing for other STIs is recommended if you are sexually active and at increased  risk.  Osteoporosis is a disease in which the bones lose minerals and strength with aging. This can result in serious bone fractures. The risk of osteoporosis can be identified using a bone density scan. Women ages 34 and over and women at risk for fractures or osteoporosis should discuss screening with their caregivers. Ask your caregiver whether you should be taking a calcium supplement or vitamin D to reduce the rate of osteoporosis.  Menopause can be associated with physical symptoms and risks. Hormone replacement therapy is available to decrease symptoms and risks. You should talk to your caregiver about whether hormone replacement therapy is right for you.  Use sunscreen with a sun protection factor (SPF) of 30 or greater. Apply sunscreen liberally and repeatedly throughout the day. You should seek shade when your shadow is shorter than you. Protect yourself by wearing long sleeves, pants, a wide-brimmed hat, and sunglasses year round, whenever you are outdoors.  Notify your caregiver of new moles or changes in moles, especially if there is a change in shape or color. Also notify your caregiver if a mole is larger than the size of a pencil eraser.  Stay current with your immunizations. Document Released: 10/23/2010 Document Revised: 07/02/2011 Document Reviewed: 10/23/2010 [facility] Patient Information 2013 [place], [place]. Tendinitis Tendinitis is swelling and inflammation of the tendons. Tendons are band-like tissues that connect muscle to bone. Tendinitis commonly occurs in the:   Shoulders (rotator cuff).  Heels (Achilles tendon).  Elbows (triceps tendon). CAUSES Tendinitis is usually caused by overusing the tendon, muscles, and joints involved. When the tissue surrounding a tendon (synovium) becomes inflamed, it is called tenosynovitis. Tendinitis commonly develops in people whose jobs require repetitive motions. SYMPTOMS  Pain.  Tenderness.  Mild  swelling. DIAGNOSIS Tendinitis is usually diagnosed by physical exam. Your caregiver may also order X-rays or other imaging tests. TREATMENT Your caregiver may recommend certain medicines or exercises for your treatment. HOME CARE INSTRUCTIONS   Use a sling or splint for as long as directed by your caregiver until the pain decreases.  Put ice on the injured area.  Put ice in a plastic bag.  Place a towel between your skin and the bag.  Leave the ice on for 15 to 20 minutes, 3 to 4 times a day.  Avoid using the limb while the tendon is painful. Perform gentle range of motion exercises only as directed by your caregiver. Stop exercises if pain or discomfort increase, unless directed otherwise by your caregiver.  Only take over-the-counter or prescription medicines for pain, discomfort, or fever as directed by your caregiver. SEEK MEDICAL CARE IF:   Your pain and swelling  increase.  You develop new, unexplained symptoms, especially increased numbness in the hands. MAKE SURE YOU:   Understand these instructions.  Will watch your condition.  Will get help right away if you are not doing well or get worse. Document Released: 04/06/2000 Document Revised: 07/02/2011 Document Reviewed: 06/26/2010 [facility] Patient Information 2013 [place], [place].

## 2012-07-23 NOTE — Progress Notes (Signed)
Subjective:    Patient ID: Mariah Rodriguez, female    DOB: [birth year], 52 y.o.   MRN: [id number]  HPI patient is here today for annual physical. Patient feels well overall.  Increase GERD symptoms - but reports "rebound" worse taking PPI - therefore chooses to use H2B prn. Describes as burning SSCP, nonexertional, not positional. symptoms worse with emotional distress  Also elbow pain R>L x 3 weeks, worse at end of day and night. Exac with any movement - ext or flexion - +overuse but no falls or direct trauma, no swelling. No other joints affected.  Also concerned about memory problems - leaving keys/phone at home/work, forgetting to fill car with gas - denies new or recent med changes. Worse with emotional stress. Overlap with chronic tremor.  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 Considerable $ implications related to same   Past Medical History  Diagnosis Date  . 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 DEPRESSION   . ADD   . PERIMENOPAUSAL SYNDROME   . Unspecified vitamin D deficiency   . Cervicalgia   . GERD (gastroesophageal reflux disease)    Family History  Problem Relation Age of Onset  . Hypertension Other     parent, grandparent, other relative  . Arthritis Other     Parent  . Osteoporosis Other    History  Substance Use Topics  . Smoking status: Never Smoker   . 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 Alcohol Use: Yes     Comment: Social     Review of Systems Constitutional: Negative for fever or weight change.  Respiratory: Negative for cough and shortness of breath.   Cardiovascular: Negative for chest pain or palpitations.  Gastrointestinal: Negative for abdominal pain, no bowel changes.  Musculoskeletal: Negative for gait  problem or joint swelling.  Skin: Negative for rash.  Neurological: Negative for dizziness. Concern for memory problems, trouble with task completion/focus.  No other specific complaints in a complete review of systems (except as listed in HPI above).     Objective:   Physical Exam BP 98/64  Pulse 76  Temp(Src) 97.9 F (36.6 C) (Oral)  Resp 14  Ht 5\' 3"  (1.6 m)  Wt 122 lb (55.339 kg)  BMI 21.62 kg/m2  SpO2 97% Wt Readings from Last 3 Encounters:  07/23/12 122 lb (55.339 kg)  01/15/11 121 lb 6.4 oz (55.067 kg)  10/23/10 118 lb 6.4 oz (53.706 kg)   Constitutional: She is emotional, but appears well-developed and well-nourished. No distress.  HENT: Head: Normocephalic and atraumatic. Ears: B TMs ok, no erythema or effusion; Nose: Nose normal. Mouth/Throat: Oropharynx is clear and moist. No oropharyngeal exudate.  Eyes: Conjunctivae and EOM are normal. Pupils are equal, round, and reactive to light. No scleral icterus.  Neck: Normal range of motion. Neck supple. No JVD present. No thyromegaly present.  Cardiovascular: Normal rate, regular rhythm and normal heart sounds.  No murmur heard. No BLE edema. Pulmonary/Chest: Effort normal and breath sounds normal. No respiratory distress. She has no wheezes.  Abdominal: Soft. Bowel sounds are normal. She exhibits no distension. There is no tenderness. no masses Musculoskeletal: B (R>L) elbow without gross deformity or swelling. Normal range of motion, no joint effusions. Tender over ECRV  - pain  with resistance to ECRV extension; pain with resistance to supination. neurovasc intact. Neurological: She is alert and oriented to person, place, and time. S/p cosmetic botox treatments of face, but no evidence for cranial nerve deficit. MAE well. Coordination, balance, speech and recall normal. Mild resting and intention tremor of hands > head/neck Skin: Skin is warm and dry. No rash noted. No erythema.  Psychiatric: She has a distractable and anxious  mood/affect, occ nearly tearful. Judgment and thought content appropriate and rational.   Lab Results  Component Value Date   WBC 7.0 06/30/2010   HGB 13.5 06/30/2010   HCT 39.2 06/30/2010   PLT 236.0 06/30/2010   GLUCOSE 94 06/30/2010   CHOL 152 06/30/2010   TRIG 35.0 06/30/2010   HDL 65.60 06/30/2010   LDLCALC 79 06/30/2010   ALT 17 06/30/2010   AST 16 06/30/2010   NA 138 06/30/2010   K 4.5 06/30/2010   CL 102 06/30/2010   CREATININE 0.8 06/30/2010   BUN 10 06/30/2010   CO2 29 06/30/2010   TSH 1.06 06/30/2010   ECG: sinus - no arrythmia or ischemic changes    Assessment & Plan:   CPX/v70.0 - Patient has been counseled on age-appropriate routine health concerns for screening and prevention. These are reviewed and up-to-date. Immunizations are up-to-date or declined. Labs ordered and reviewed.  Elbow tendonitis - R>L, precipitated by overuse, symptoms worse at night - no swelling or direct injury. Unable to tolerate OTC NSAIDs due to GI upset - try Celebrex - erx and discount card given today  Memory issues - suspect emotional stress component and psyc dx (ADD, anxiety)+meds. Hx TBI and post concussive syndrome reviewed - Neuro exam without focal deficits. Pt declines need for repeat MRI at this time, but will refer to neuro for review  Atypical chest pain - ECG today - no cardiac concern - ?GERD with emotional overlap - continue H2B, check screening labs with CPX and follow up if unimproved

## 2012-07-23 NOTE — Assessment & Plan Note (Signed)
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

## 2012-07-25 ENCOUNTER — Other Ambulatory Visit (INDEPENDENT_AMBULATORY_CARE_PROVIDER_SITE_OTHER): Payer: BC Managed Care – PPO

## 2012-07-25 ENCOUNTER — Other Ambulatory Visit: Payer: Self-pay | Admitting: Internal Medicine

## 2012-07-25 ENCOUNTER — Telehealth: Payer: Self-pay | Admitting: *Deleted

## 2012-07-25 DIAGNOSIS — Z0000 Encounter for general adult medical examination without abnormal findings: Secondary | ICD-10-CM

## 2012-07-25 DIAGNOSIS — N6320 Unspecified lump in the left breast, unspecified quadrant: Secondary | ICD-10-CM

## 2012-07-25 LAB — BASIC METABOLIC PANEL
BUN: 12 mg/dL (ref 6–23)
Creatinine, Ser: 0.9 mg/dL (ref 0.4–1.2)
GFR: 71.22 mL/min (ref >60.00)

## 2012-07-25 LAB — CBC WITH DIFFERENTIAL/PLATELET
Basophils Relative: 1.3 % (ref 0.0–3.0)
Eosinophils Absolute: 0.1 10*3/uL (ref 0.0–0.7)
HCT: 40.6 % (ref 36.0–46.0)
Lymphs Abs: 1.1 10*3/uL (ref 0.7–4.0)
MCHC: 33.5 g/dL (ref 30.0–36.0)
MCV: 94.4 fl (ref 78.0–100.0)
Monocytes Absolute: 0.4 10*3/uL (ref 0.1–1.0)
Neutrophils Relative %: 74.3 % (ref 43.0–77.0)
RBC: 4.3 Mil/uL (ref 3.87–5.11)

## 2012-07-25 LAB — HEPATIC FUNCTION PANEL
ALT: 15 U/L (ref 0–35)
Albumin: 4 g/dL (ref 3.5–5.2)
Alkaline Phosphatase: 41 U/L (ref 39–117)
Bilirubin, Direct: 0.2 mg/dL (ref 0.0–0.3)
Total Protein: 7.1 g/dL (ref 6.0–8.3)

## 2012-07-25 LAB — URINALYSIS, ROUTINE W REFLEX MICROSCOPIC
Bilirubin Urine: NEGATIVE
Urine Glucose: NEGATIVE
Urobilinogen, UA: 0.2 (ref 0.0–1.0)

## 2012-07-25 LAB — LIPID PANEL
Cholesterol: 155 mg/dL (ref 0–200)
Triglycerides: 25 mg/dL (ref 0.0–149.0)
VLDL: 5 mg/dL (ref 0.0–40.0)

## 2012-07-25 LAB — TSH: TSH: 1.35 u[IU]/mL (ref 0.35–5.50)

## 2012-07-25 NOTE — Telephone Encounter (Signed)
done

## 2012-07-25 NOTE — Telephone Encounter (Signed)
Left smg on vm stating md put order in for mammogram screening. Last time they saw pt 08/01/07 they recommend a 6 months f/u. Pt didn't follow-up. Requesting order for Diagnostic bilateral with U/S of (L) breast to be entered...[name]

## 2012-08-04 ENCOUNTER — Telehealth: Payer: Self-pay | Admitting: *Deleted

## 2012-08-04 NOTE — Telephone Encounter (Signed)
Received PA back from insurance. Celebrex rx still denied. Place PA on md desk for review...lmb

## 2012-08-05 MED ORDER — MELOXICAM 15 MG PO TABS: 15.0000 mg | ORAL_TABLET | Freq: Every day | ORAL | Status: DC

## 2012-08-05 NOTE — Telephone Encounter (Signed)
Let pt know same - i have sent erx for meloxicam to use for elbow pain in place of Celebrex - thanks

## 2012-08-05 NOTE — Telephone Encounter (Signed)
Notified pt with md response.../lmb 

## 2012-08-06 ENCOUNTER — Ambulatory Visit (INDEPENDENT_AMBULATORY_CARE_PROVIDER_SITE_OTHER): Payer: BC Managed Care – PPO | Admitting: Neurology

## 2012-08-06 ENCOUNTER — Encounter: Payer: Self-pay | Admitting: Neurology

## 2012-08-06 VITALS — BP 110/70 | HR 84 | Temp 98.2°F | Resp 12 | Ht 63.0 in | Wt 120.0 lb

## 2012-08-06 DIAGNOSIS — G43809 Other migraine, not intractable, without status migrainosus: Secondary | ICD-10-CM

## 2012-08-06 MED ORDER — GABAPENTIN 100 MG PO CAPS: ORAL_CAPSULE | ORAL | Status: DC

## 2012-08-06 NOTE — Patient Instructions (Addendum)
Follow up in about a week for acupuncture with Dr. Smiley Houseman.

## 2012-08-06 NOTE — Progress Notes (Signed)
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  She also has a history of a fall with a head injury in 2010 resulting in a small periorbital fracture on the left.  She was already taking ADD medications at that time, but following the injury, she had more difficulty in doing normal routine tasks of daily living such as finding her keys. This is compounded recently due to the work-related stress.she was self-employed at that time and she could not afford to miss work and she did not.  She notes that she is sensitive to patterns and flickering lights and she gets headaches at the lateral corner of her left eye it can extend old in the temples or down into the cheekbone or around the left eye. She sometimes has trouble thinking of certain words worse when she is under stress.  She's also recently had an insight that when she's feeling anxious, her voice goes up and this can sometimes irritate other people although she is not necessarily aware of it as is happening.  She has 2 children and she typically falls asleep with them around 8:00 until 11 that she gets up and takes her trazodone 100 mg and goes back to sleep sometimes until 7.  The trazodone is extremely helpful. Without it, she sleeps very poorly. Sometimes she'll wake up with her arms flexed and her fists clench which she thinks may be due to stress and then it is hard to even straighten her arms out when she first wakes up.  She had her gallbladder removed when she was 21. She had right ACL repair in 2010.  She had 2 arthroscopic procedures and a right knee prior to that.  Review of systems is positive for GERD, anxiety, ADD, depression, headaches, pattern and light sensitivity, and occasional back pain.  Remainder of review of systems is negative.  Past Medical History  Diagnosis Date  . 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 DEPRESSION   .  ADD   . PERIMENOPAUSAL SYNDROME   . Unspecified vitamin D deficiency   . Cervicalgia   . GERD (gastroesophageal reflux disease)     Current Outpatient Prescriptions on File Prior to Visit  Medication Sig Dispense Refill  . amphetamine-dextroamphetamine (ADDERALL) 30 MG tablet Take 1 tablet (30 mg total) by mouth daily.  30 tablet  0  . cholecalciferol (VITAMIN D) 1000 UNITS tablet Take 1,000 Units by mouth daily.        . clonazePAM (KLONOPIN) 0.5 MG tablet Take 1 tablet (0.5 mg total) by mouth 2 (two) times daily as needed for anxiety.  20 tablet  1  . estradiol-norethindrone (COMBIPATCH) 0.05-0.14 MG/DAY Place 1 patch onto the skin 2 (two) times a week.      . famotidine (PEPCID) 40 MG tablet Take 1 tablet (40 mg total) by mouth daily.      Marland Kitchen loratadine (CLARITIN) 10 MG tablet Take 10 mg by mouth daily as needed. For allergy symptoms      . meloxicam (MOBIC) 15 MG tablet Take 1 tablet (15 mg total) by mouth daily.  30 tablet  0  . traZODone (DESYREL) 100 MG tablet Take 100 mg by mouth at bedtime as needed. For sleep       No current facility-administered medications on file prior to visit.   Review of patient's allergies indicates no known allergies.  History   Social History  .  Marital Status: Single    Spouse Name: N/A    Number of Children: N/A  . Years of Education: N/A   Occupational History  . Not on file.   Social History Main Topics  . Smoking status: Never Smoker   . 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 Alcohol Use: Yes     Comment: Social  . Drug Use: No  . Sexually Active: Not on file   Other Topics Concern  . Not on file   Social History Narrative  . No narrative on file    Family History  Problem Relation Age of Onset  . Hypertension Other     parent, grandparent, other relative  . Arthritis Other     Parent  . Osteoporosis Other     BP 110/70  Pulse 84  Temp(Src) 98.2  F (36.8 C)  Resp 12  Ht 5\' 3"  (1.6 m)  Wt 120 lb (54.432 kg)  BMI 21.26 kg/m2   Alert and oriented x 3.  Memory function appears to be intact.  Concentration and attention are normal for educational level and background.  Speech is fluent and without significant word finding difficulty.  Is aware of current events.  No carotid bruits detected.  Cranial nerve II through XII are within normal limits.  This includes normal optic discs and acuity, EOMI, PERLA, facial movement and sensation intact, hearing grossly intact, gag intact,Uvula raises symmetrically and tongue protrudes evenly. Motor strength is 5 over 5 throughout all limbs.  No atrophy, abnormal tone or tremors. Reflexes are 2+ and symmetric in the upper and lower extremities Sensory exam is intact. Coordination is intact for fine movements and rapid alternating movements in all limbs Gait and station are normal.   Impression: 1. History of head injury with left periorbital headaches and a prior history of occasional migraines. She is also exhibiting pattern sensitivity and light sensitivity.  I feel atypical migraines could be contributing to her overall symptom complex. 2.  ADD exacerbated by stress and anxiety in very difficult life circumstances regarding her vocation. 3. Insomnia responding very well to trazodone 100 mg.  Plan: We will add Neurontin 200 to 300mg  at night in addition to the trazodone for the headaches. She'll return in one week for acupuncture. We recommended that she read a book and mindfulness-based stress reduction by Montez Hageman.  We discussed the possibility of training in [facility].

## 2012-08-11 ENCOUNTER — Ambulatory Visit
Admission: RE | Admit: 2012-08-11 | Discharge: 2012-08-11 | Disposition: A | Discharge status: Home / Self Care | Payer: BC Managed Care – PPO | Source: Ambulatory Visit | Attending: Internal Medicine | Admitting: Internal Medicine

## 2012-08-11 DIAGNOSIS — N6320 Unspecified lump in the left breast, unspecified quadrant: Secondary | ICD-10-CM

## 2012-08-18 ENCOUNTER — Ambulatory Visit: Payer: BC Managed Care – PPO | Admitting: Neurology

## 2012-08-27 ENCOUNTER — Ambulatory Visit (INDEPENDENT_AMBULATORY_CARE_PROVIDER_SITE_OTHER): Payer: BC Managed Care – PPO | Admitting: Neurology

## 2012-08-27 ENCOUNTER — Encounter: Payer: Self-pay | Admitting: Neurology

## 2012-08-27 ENCOUNTER — Telehealth: Payer: Self-pay | Admitting: *Deleted

## 2012-08-27 VITALS — BP 90/60 | HR 80 | Temp 98.0°F | Resp 12 | Ht 63.0 in | Wt 122.0 lb

## 2012-08-27 DIAGNOSIS — G43119 Migraine with aura, intractable, without status migrainosus: Secondary | ICD-10-CM

## 2012-08-27 DIAGNOSIS — G43819 Other migraine, intractable, without status migrainosus: Secondary | ICD-10-CM

## 2012-08-27 MED ORDER — AMPHETAMINE-DEXTROAMPHETAMINE 30 MG PO TABS: 30.0000 mg | ORAL_TABLET | Freq: Every day | ORAL | Status: DC

## 2012-08-27 MED ORDER — TOPIRAMATE 25 MG PO CPSP: 25.0000 mg | ORAL_CAPSULE | Freq: Every day | ORAL | Status: DC

## 2012-08-27 NOTE — Telephone Encounter (Signed)
Pt requesting refill of Adderall-last written 07/23/2012 #30 with 0 refill-please advise.

## 2012-08-27 NOTE — Telephone Encounter (Signed)
Pt informed via VM rx ready for pickup, placed upfront in cabinet.
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
## 2012-08-27 NOTE — Telephone Encounter (Signed)
done

## 2012-09-10 ENCOUNTER — Ambulatory Visit (INDEPENDENT_AMBULATORY_CARE_PROVIDER_SITE_OTHER): Payer: BC Managed Care – PPO | Admitting: Neurology

## 2012-09-10 ENCOUNTER — Encounter: Payer: Self-pay | Admitting: Neurology

## 2012-09-10 VITALS — BP 90/50 | HR 66 | Temp 98.0°F | Ht 63.0 in | Wt 123.0 lb

## 2012-09-10 DIAGNOSIS — G43809 Other migraine, not intractable, without status migrainosus: Secondary | ICD-10-CM

## 2012-09-10 NOTE — Patient Instructions (Addendum)
Follow up for acupuncture in 3 weeks.

## 2012-09-10 NOTE — Progress Notes (Signed)
Return for acupuncture visit today.  She tolerated the previous treatment well. The headaches are less true she still has some headaches and neck tension but not as frequent.  She is reading the myoclonus will again she has found some insights from that to be helpful.  She hasn't been taking the Topamax every night due to some misunderstanding of the dosing regimen.  No side effects to report.  She also has a right tennis elbow symptoms.  Procedure wise, we are using acupuncture today including liver 3, large intestine 4, large intestine 11 and lung 6.  Also liver point at the right ear for 15 minutes followed by Bl 14, Bl 40 and 60 and GB 21 on left.  Well tolerated and she will returnin 3 weeks.

## 2012-10-06 ENCOUNTER — Ambulatory Visit: Payer: BC Managed Care – PPO | Admitting: Neurology

## 2012-10-15 ENCOUNTER — Telehealth: Payer: Self-pay | Admitting: *Deleted

## 2012-10-15 NOTE — Telephone Encounter (Signed)
Left msg on vm needing refill on her adderral. Also want to ask md can she refill her prozac & trazodone. MD that normally fill them is in [place] not able to get down there to see him. Pls advise...[name]

## 2012-10-16 MED ORDER — FLUOXETINE HCL 40 MG PO CAPS: 40.0000 mg | ORAL_CAPSULE | Freq: Every day | ORAL | Status: DC

## 2012-10-16 MED ORDER — AMPHETAMINE-DEXTROAMPHETAMINE 30 MG PO TABS: 30.0000 mg | ORAL_TABLET | Freq: Every day | ORAL | Status: DC

## 2012-10-16 MED ORDER — TRAZODONE HCL 100 MG PO TABS: 100.0000 mg | ORAL_TABLET | Freq: Every evening | ORAL | Status: DC | PRN

## 2012-10-16 NOTE — Telephone Encounter (Signed)
Ok to refill all as requested

## 2012-10-16 NOTE — Telephone Encounter (Signed)
Called pt no answer LMOM rx for adderral ready for pick-up, other two was sent to [facility]...Mariah Rodriguez

## 2012-12-12 ENCOUNTER — Telehealth: Payer: Self-pay | Admitting: *Deleted

## 2012-12-12 MED ORDER — AMPHETAMINE-DEXTROAMPHETAMINE 30 MG PO TABS: 30.0000 mg | ORAL_TABLET | Freq: Every day | ORAL | Status: DC

## 2012-12-12 NOTE — Telephone Encounter (Signed)
Pt called and lvm requesting rx refill for her adderall. Call back [phone].

## 2012-12-12 NOTE — Telephone Encounter (Signed)
Ok done

## 2012-12-12 NOTE — Telephone Encounter (Signed)
Called pt no answer LMOM rx ready for pick-up.../lmb 

## 2013-01-24 ENCOUNTER — Other Ambulatory Visit: Payer: Self-pay | Admitting: Internal Medicine

## 2013-02-03 ENCOUNTER — Telehealth: Payer: Self-pay | Admitting: *Deleted

## 2013-02-03 MED ORDER — AMPHETAMINE-DEXTROAMPHETAMINE 30 MG PO TABS: 30.0000 mg | ORAL_TABLET | Freq: Every day | ORAL | Status: DC

## 2013-02-03 NOTE — Telephone Encounter (Signed)
Spoke with pt advised Rx ready for pick up 

## 2013-02-03 NOTE — Telephone Encounter (Signed)
Pt called requesting refill for Adderall. Please advise ° °

## 2013-02-03 NOTE — Telephone Encounter (Signed)
ok 

## 2013-05-21 ENCOUNTER — Other Ambulatory Visit: Payer: Self-pay | Admitting: Internal Medicine

## 2013-06-17 ENCOUNTER — Telehealth: Payer: Self-pay | Admitting: Internal Medicine

## 2013-06-17 ENCOUNTER — Encounter: Payer: Self-pay | Admitting: *Deleted

## 2013-06-17 MED ORDER — AMPHETAMINE-DEXTROAMPHETAMINE 30 MG PO TABS: 30.0000 mg | ORAL_TABLET | Freq: Every day | ORAL | Status: DC

## 2013-06-17 NOTE — Telephone Encounter (Signed)
Called pt no answer LMOM with md response.../lmb 

## 2013-06-17 NOTE — Telephone Encounter (Signed)
Ok - note need for screening with [facility] prior to picking up refill (if not already done)  

## 2013-06-17 NOTE — Telephone Encounter (Signed)
Pt called requesting refill for Adderall. Please advise

## 2013-07-13 ENCOUNTER — Encounter: Payer: Self-pay | Admitting: Internal Medicine

## 2013-09-07 ENCOUNTER — Other Ambulatory Visit: Payer: Self-pay | Admitting: Neurology

## 2013-09-22 ENCOUNTER — Other Ambulatory Visit: Payer: Self-pay | Admitting: Internal Medicine

## 2013-09-22 MED ORDER — AMPHETAMINE-DEXTROAMPHETAMINE 30 MG PO TABS: 30.0000 mg | ORAL_TABLET | Freq: Every day | ORAL | Status: DC

## 2013-09-22 NOTE — Telephone Encounter (Signed)
done

## 2013-09-22 NOTE — Telephone Encounter (Signed)
Notified pt rx ready for pick-up.../lmb 

## 2013-09-22 NOTE — Telephone Encounter (Signed)
Pt request refill for adderall. Please call pt

## 2013-10-05 LAB — HM PAP SMEAR

## 2013-10-09 ENCOUNTER — Other Ambulatory Visit: Payer: Self-pay | Admitting: Internal Medicine

## 2013-10-19 ENCOUNTER — Other Ambulatory Visit: Payer: Self-pay | Admitting: Internal Medicine

## 2013-10-21 DIAGNOSIS — S6992XA Unspecified injury of left wrist, hand and finger(s), initial encounter: Secondary | ICD-10-CM

## 2013-10-21 HISTORY — DX: Unspecified injury of left wrist, hand and finger(s), initial encounter: S69.92XA

## 2013-11-02 ENCOUNTER — Other Ambulatory Visit: Payer: Self-pay | Admitting: Internal Medicine

## 2013-11-02 ENCOUNTER — Telehealth: Payer: Self-pay

## 2013-11-02 DIAGNOSIS — F329 Major depressive disorder, single episode, unspecified: Secondary | ICD-10-CM

## 2013-11-02 DIAGNOSIS — F3289 Other specified depressive episodes: Secondary | ICD-10-CM

## 2013-11-02 MED ORDER — TRAZODONE HCL 100 MG PO TABS: ORAL_TABLET | ORAL | Status: DC

## 2013-11-03 NOTE — Telephone Encounter (Signed)
rx sent to pharm

## 2013-11-26 ENCOUNTER — Other Ambulatory Visit: Payer: Self-pay

## 2013-11-26 ENCOUNTER — Telehealth: Payer: Self-pay | Admitting: Internal Medicine

## 2013-11-26 DIAGNOSIS — F329 Major depressive disorder, single episode, unspecified: Secondary | ICD-10-CM

## 2013-11-26 DIAGNOSIS — F3289 Other specified depressive episodes: Secondary | ICD-10-CM

## 2013-11-26 MED ORDER — AMPHETAMINE-DEXTROAMPHETAMINE 30 MG PO TABS: 30.0000 mg | ORAL_TABLET | Freq: Every day | ORAL | Status: DC

## 2013-11-26 MED ORDER — TRAZODONE HCL 100 MG PO TABS: ORAL_TABLET | ORAL | Status: DC

## 2013-11-26 NOTE — Telephone Encounter (Signed)
Patient is calling to request refills on her Adderall and traZODone prescriptions. She is scheduled to come in for her yearly physical on 01/12/14. Please advise.

## 2013-11-27 NOTE — Telephone Encounter (Signed)
rx are ready for pick up in the office.

## 2014-01-12 ENCOUNTER — Other Ambulatory Visit (INDEPENDENT_AMBULATORY_CARE_PROVIDER_SITE_OTHER): Payer: BC Managed Care – PPO

## 2014-01-12 ENCOUNTER — Ambulatory Visit (INDEPENDENT_AMBULATORY_CARE_PROVIDER_SITE_OTHER): Payer: BC Managed Care – PPO | Admitting: Internal Medicine

## 2014-01-12 ENCOUNTER — Encounter: Payer: Self-pay | Admitting: Internal Medicine

## 2014-01-12 VITALS — BP 108/68 | HR 66 | Temp 97.9°F | Ht 63.0 in | Wt 127.0 lb

## 2014-01-12 DIAGNOSIS — Z0000 Encounter for general adult medical examination without abnormal findings: Secondary | ICD-10-CM

## 2014-01-12 DIAGNOSIS — Z1239 Encounter for other screening for malignant neoplasm of breast: Secondary | ICD-10-CM

## 2014-01-12 DIAGNOSIS — Z23 Encounter for immunization: Secondary | ICD-10-CM

## 2014-01-12 DIAGNOSIS — Z1211 Encounter for screening for malignant neoplasm of colon: Secondary | ICD-10-CM

## 2014-01-12 LAB — URINALYSIS, ROUTINE W REFLEX MICROSCOPIC
Bilirubin Urine: NEGATIVE
Hgb urine dipstick: NEGATIVE
KETONES UR: NEGATIVE
Leukocytes, UA: NEGATIVE
Nitrite: NEGATIVE
PH: 7.5 (ref 5.0–8.0)
RBC / HPF: NONE SEEN (ref 0–?)
Specific Gravity, Urine: 1.01 (ref 1.000–1.030)
Total Protein, Urine: NEGATIVE
Urine Glucose: NEGATIVE
Urobilinogen, UA: 0.2 (ref 0.0–1.0)
WBC, UA: NONE SEEN (ref 0–?)

## 2014-01-12 LAB — TSH: TSH: 1.25 u[IU]/mL (ref 0.35–4.50)

## 2014-01-12 LAB — CBC WITH DIFFERENTIAL/PLATELET
Basophils Absolute: 0.1 10*3/uL (ref 0.0–0.1)
Basophils Relative: 0.9 % (ref 0.0–3.0)
EOS ABS: 0.1 10*3/uL (ref 0.0–0.7)
EOS PCT: 1.2 % (ref 0.0–5.0)
HEMATOCRIT: 42.6 % (ref 36.0–46.0)
Hemoglobin: 14.3 g/dL (ref 12.0–15.0)
LYMPHS ABS: 1.4 10*3/uL (ref 0.7–4.0)
Lymphocytes Relative: 18.6 % (ref 12.0–46.0)
MCHC: 33.5 g/dL (ref 30.0–36.0)
MCV: 94.9 fl (ref 78.0–100.0)
MONO ABS: 0.5 10*3/uL (ref 0.1–1.0)
Monocytes Relative: 7 % (ref 3.0–12.0)
Neutro Abs: 5.3 10*3/uL (ref 1.4–7.7)
Neutrophils Relative %: 72.3 % (ref 43.0–77.0)
Platelets: 250 10*3/uL (ref 150.0–400.0)
RBC: 4.49 Mil/uL (ref 3.87–5.11)
RDW: 13.4 % (ref 11.5–15.5)
WBC: 7.4 10*3/uL (ref 4.0–10.5)

## 2014-01-12 LAB — BASIC METABOLIC PANEL
BUN: 12 mg/dL (ref 6–23)
CHLORIDE: 103 meq/L (ref 96–112)
CO2: 27 meq/L (ref 19–32)
CREATININE: 0.8 mg/dL (ref 0.4–1.2)
Calcium: 9.2 mg/dL (ref 8.4–10.5)
GFR: 82.45 mL/min (ref >60.00)
GLUCOSE: 91 mg/dL (ref 70–99)
POTASSIUM: 4.4 meq/L (ref 3.5–5.1)
Sodium: 136 mEq/L (ref 135–145)

## 2014-01-12 LAB — LIPID PANEL
CHOL/HDL RATIO: 2
Cholesterol: 166 mg/dL (ref 0–200)
HDL: 68.9 mg/dL (ref >39.00)
LDL CALC: 87 mg/dL (ref 0–99)
NonHDL: 97.1
TRIGLYCERIDES: 51 mg/dL (ref 0.0–149.0)
VLDL: 10.2 mg/dL (ref 0.0–40.0)

## 2014-01-12 LAB — HEPATIC FUNCTION PANEL
ALT: 20 U/L (ref 0–35)
AST: 18 U/L (ref 0–37)
Albumin: 4 g/dL (ref 3.5–5.2)
Alkaline Phosphatase: 45 U/L (ref 39–117)
BILIRUBIN DIRECT: 0.1 mg/dL (ref 0.0–0.3)
BILIRUBIN TOTAL: 0.6 mg/dL (ref 0.2–1.2)
Total Protein: 7.2 g/dL (ref 6.0–8.3)

## 2014-01-12 LAB — VITAMIN D 25 HYDROXY (VIT D DEFICIENCY, FRACTURES): VITD: 29.25 ng/mL — ABNORMAL LOW (ref 30.00–100.00)

## 2014-01-12 MED ORDER — TRAZODONE HCL 100 MG PO TABS: 100.0000 mg | ORAL_TABLET | Freq: Every day | ORAL | Status: DC

## 2014-01-12 MED ORDER — FLUOXETINE HCL 40 MG PO CAPS
40.0000 mg | ORAL_CAPSULE | Freq: Every day | ORAL | Status: DC
Start: 2014-01-12 — End: 2014-11-06

## 2014-01-12 MED ORDER — AMPHETAMINE-DEXTROAMPHETAMINE 30 MG PO TABS: 30.0000 mg | ORAL_TABLET | Freq: Every day | ORAL | Status: DC

## 2014-01-12 NOTE — Patient Instructions (Addendum)
It was good to see you today.  We have reviewed your prior records including labs and tests today  Your annual flu shot was given and/or updated today.  Health Maintenance reviewed - will refer for mammogram screening and cologaurd for colon cancer screening - all other recommended immunizations and age-appropriate screenings are up-to-date.  We'll have you screened for colon cancer with COLOGUARD (stool DNA testing) - this packet will be sent to your home by the testing company. Complete instructions as in the box, and we will contact you with the results once available.  Test(s) ordered today. Your results will be released to [facility] (or called to you) after review, usually within 72hours after test completion. If any changes need to be made, you will be notified at that same time.  Medications reviewed and updated, no changes recommended at this time. Refill on medication(s) as discussed today.  Please schedule followup in 12 months for annual exam and labs, call sooner if problems.  Health Maintenance Adopting a healthy lifestyle and getting preventive care can go a long way to promote health and wellness. Talk with your health care provider about what schedule of regular examinations is right for you. This is a good chance for you to check in with your provider about disease prevention and staying healthy. In between checkups, there are plenty of things you can do on your own. Experts have done a lot of research about which lifestyle changes and preventive measures are most likely to keep you healthy. Ask your health care provider for more information. WEIGHT AND DIET  Eat a healthy diet  Be sure to include plenty of vegetables, fruits, low-fat dairy products, and lean protein.  Do not eat a lot of foods high in solid fats, added sugars, or salt.  Get regular exercise. This is one of the most important things you can do for your health.  Most adults should exercise for at least 150  minutes each week. The exercise should increase your heart rate and make you sweat (moderate-intensity exercise).  Most adults should also do strengthening exercises at least twice a week. This is in addition to the moderate-intensity exercise.  Maintain a healthy weight  Body mass index (BMI) is a measurement that can be used to identify possible weight problems. It estimates body fat based on height and weight. Your health care provider can help determine your BMI and help you achieve or maintain a healthy weight.  For females 45 years of age and older:   A BMI below 18.5 is considered underweight.  A BMI of 18.5 to 24.9 is normal.  A BMI of 25 to 29.9 is considered overweight.  A BMI of 30 and above is considered obese.  Watch levels of cholesterol and blood lipids  You should start having your blood tested for lipids and cholesterol at 52 years of age, then have this test every 5 years.  You may need to have your cholesterol levels checked more often if:  Your lipid or cholesterol levels are high.  You are older than 52 years of age.  You are at high risk for heart disease.  CANCER SCREENING   Lung Cancer  Lung cancer screening is recommended for adults 15-24 years old who are at high risk for lung cancer because of a history of smoking.  A yearly low-dose CT scan of the lungs is recommended for people who:  Currently smoke.  Have quit within the past 15 years.  Have at least a  30-pack-year history of smoking. A pack year is smoking an average of one pack of cigarettes a day for 1 year.  Yearly screening should continue until it has been 15 years since you quit.  Yearly screening should stop if you develop a health problem that would prevent you from having lung cancer treatment.  Breast Cancer  Practice breast self-awareness. This means understanding how your breasts normally appear and feel.  It also means doing regular breast self-exams. Let your health  care provider know about any changes, no matter how small.  If you are in your 20s or 30s, you should have a clinical breast exam (CBE) by a health care provider every 1-3 years as part of a regular health exam.  If you are 63 or older, have a CBE every year. Also consider having a breast X-ray (mammogram) every year.  If you have a family history of breast cancer, talk to your health care provider about genetic screening.  If you are at high risk for breast cancer, talk to your health care provider about having an MRI and a mammogram every year.  Breast cancer gene (BRCA) assessment is recommended for women who have family members with BRCA-related cancers. BRCA-related cancers include:  Breast.  Ovarian.  Tubal.  Peritoneal cancers.  Results of the assessment will determine the need for genetic counseling and BRCA1 and BRCA2 testing. Cervical Cancer Routine pelvic examinations to screen for cervical cancer are no longer recommended for nonpregnant women who are considered low risk for cancer of the pelvic organs (ovaries, uterus, and vagina) and who do not have symptoms. A pelvic examination may be necessary if you have symptoms including those associated with pelvic infections. Ask your health care provider if a screening pelvic exam is right for you.   The Pap test is the screening test for cervical cancer for women who are considered at risk.  If you had a hysterectomy for a problem that was not cancer or a condition that could lead to cancer, then you no longer need Pap tests.  If you are older than 65 years, and you have had normal Pap tests for the past 10 years, you no longer need to have Pap tests.  If you have had past treatment for cervical cancer or a condition that could lead to cancer, you need Pap tests and screening for cancer for at least 20 years after your treatment.  If you no longer get a Pap test, assess your risk factors if they change (such as having a new  sexual partner). This can affect whether you should start being screened again.  Some women have medical problems that increase their chance of getting cervical cancer. If this is the case for you, your health care provider may recommend more frequent screening and Pap tests.  The human papillomavirus (HPV) test is another test that may be used for cervical cancer screening. The HPV test looks for the virus that can cause cell changes in the cervix. The cells collected during the Pap test can be tested for HPV.  The HPV test can be used to screen women 55 years of age and older. Getting tested for HPV can extend the interval between normal Pap tests from three to five years.  An HPV test also should be used to screen women of any age who have unclear Pap test results.  After 52 years of age, women should have HPV testing as often as Pap tests.  Colorectal Cancer  This type  of cancer can be detected and often prevented.  Routine colorectal cancer screening usually begins at 52 years of age and continues through 52 years of age.  Your health care provider may recommend screening at an earlier age if you have risk factors for colon cancer.  Your health care provider may also recommend using home test kits to check for hidden blood in the stool.  A small camera at the end of a tube can be used to examine your colon directly (sigmoidoscopy or colonoscopy). This is done to check for the earliest forms of colorectal cancer.  Routine screening usually begins at age 72.  Direct examination of the colon should be repeated every 5-10 years through 52 years of age. However, you may need to be screened more often if early forms of precancerous polyps or small growths are found. Skin Cancer  Check your skin from head to toe regularly.  Tell your health care provider about any new moles or changes in moles, especially if there is a change in a mole's shape or color.  Also tell your health care  provider if you have a mole that is larger than the size of a pencil eraser.  Always use sunscreen. Apply sunscreen liberally and repeatedly throughout the day.  Protect yourself by wearing long sleeves, pants, a wide-brimmed hat, and sunglasses whenever you are outside. HEART DISEASE, DIABETES, AND HIGH BLOOD PRESSURE   Have your blood pressure checked at least every 1-2 years. High blood pressure causes heart disease and increases the risk of stroke.  If you are between 5 years and 70 years old, ask your health care provider if you should take aspirin to prevent strokes.  Have regular diabetes screenings. This involves taking a blood sample to check your fasting blood sugar level.  If you are at a normal weight and have a low risk for diabetes, have this test once every three years after 52 years of age.  If you are overweight and have a high risk for diabetes, consider being tested at a younger age or more often. PREVENTING INFECTION  Hepatitis B  If you have a higher risk for hepatitis B, you should be screened for this virus. You are considered at high risk for hepatitis B if:  You were born in a country where hepatitis B is common. Ask your health care provider which countries are considered high risk.  Your parents were born in a high-risk country, and you have not been immunized against hepatitis B (hepatitis B vaccine).  You have HIV or AIDS.  You use needles to inject street drugs.  You live with someone who has hepatitis B.  You have had sex with someone who has hepatitis B.  You get hemodialysis treatment.  You take certain medicines for conditions, including cancer, organ transplantation, and autoimmune conditions. Hepatitis C  Blood testing is recommended for:  Everyone born from [birth year] through 1964.  Anyone with known risk factors for hepatitis C. Sexually transmitted infections (STIs)  You should be screened for sexually transmitted infections (STIs)  including gonorrhea and chlamydia if:  You are sexually active and are younger than 52 years of age.  You are older than 52 years of age and your health care provider tells you that you are at risk for this type of infection.  Your sexual activity has changed since you were last screened and you are at an increased risk for chlamydia or gonorrhea. Ask your health care provider if you are at risk.  If you do not have HIV, but are at risk, it may be recommended that you take a prescription medicine daily to prevent HIV infection. This is called pre-exposure prophylaxis (PrEP). You are considered at risk if:  You are sexually active and do not regularly use condoms or know the HIV status of your partner(s).  You take drugs by injection.  You are sexually active with a partner who has HIV. Talk with your health care provider about whether you are at high risk of being infected with HIV. If you choose to begin PrEP, you should first be tested for HIV. You should then be tested every 3 months for as long as you are taking PrEP.  PREGNANCY   If you are premenopausal and you may become pregnant, ask your health care provider about preconception counseling.  If you may become pregnant, take 400 to 800 micrograms (mcg) of folic acid every day.  If you want to prevent pregnancy, talk to your health care provider about birth control (contraception). OSTEOPOROSIS AND MENOPAUSE   Osteoporosis is a disease in which the bones lose minerals and strength with aging. This can result in serious bone fractures. Your risk for osteoporosis can be identified using a bone density scan.  If you are 54 years of age or older, or if you are at risk for osteoporosis and fractures, ask your health care provider if you should be screened.  Ask your health care provider whether you should take a calcium or vitamin D supplement to lower your risk for osteoporosis.  Menopause may have certain physical symptoms and  risks.  Hormone replacement therapy may reduce some of these symptoms and risks. Talk to your health care provider about whether hormone replacement therapy is right for you.  HOME CARE INSTRUCTIONS   Schedule regular health, dental, and eye exams.  Stay current with your immunizations.   Do not use any tobacco products including cigarettes, chewing tobacco, or electronic cigarettes.  If you are pregnant, do not drink alcohol.  If you are breastfeeding, limit how much and how often you drink alcohol.  Limit alcohol intake to no more than 1 drink per day for nonpregnant women. One drink equals 12 ounces of beer, 5 ounces of wine, or 1 ounces of hard liquor.  Do not use street drugs.  Do not share needles.  Ask your health care provider for help if you need support or information about quitting drugs.  Tell your health care provider if you often feel depressed.  Tell your health care provider if you have ever been abused or do not feel safe at home. Document Released: 10/23/2010 Document Revised: 08/24/2013 Document Reviewed: 03/11/2013 [facility] Patient Information 2015 [place], [place]. This information is not intended to replace advice given to you by your health care provider. Make sure you discuss any questions you have with your health care provider.

## 2014-01-12 NOTE — Progress Notes (Signed)
Subjective:    Patient ID: Mariah Rodriguez, female    DOB: 1960-04-28, 53 y.o.   MRN: [id number]  HPI  patient is here today for annual physical. Patient feels well and has no complaints.  Also reviewed chronic medical issues and interval medical events  Past Medical History  Diagnosis Date  . 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 DEPRESSION   . ADD   . PERIMENOPAUSAL SYNDROME   . Unspecified vitamin D deficiency   . Cervicalgia   . GERD (gastroesophageal reflux disease)   . Injury of left index finger 10/2013    fracture PIP   Family History  Problem Relation Age of Onset  . Hypertension Sister   . Hypertension Mother   . Hypertension Maternal Grandmother   . Osteoarthritis Mother   . Polymyalgia rheumatica Mother   . Liver cancer Father 28   History  Substance Use Topics  . Smoking status: Never Smoker   . Smokeless tobacco: Never Used  . Alcohol Use: Yes     Comment: Social    Review of Systems  Constitutional: Positive for fatigue. Negative for unexpected weight change.  Respiratory: Negative for cough, shortness of breath and wheezing.   Cardiovascular: Negative for chest pain, palpitations and leg swelling.  Gastrointestinal: Negative for nausea, abdominal pain and diarrhea.  Neurological: Negative for dizziness, weakness, light-headedness and headaches.  Psychiatric/Behavioral: Negative for dysphoric mood. The patient is not nervous/anxious.   All other systems reviewed and are negative.      Objective:   Physical Exam  BP 108/68  Pulse 66  Temp(Src) 97.9 F (36.6 C) (Oral)  Ht  (1.6 m)  Wt 127 lb (57.607 kg)  BMI 22.50 kg/m2  SpO2 98% Wt Readings from Last 3 Encounters:  01/12/14 127 lb (57.607 kg)  09/10/12 123 lb (55.792 kg)  08/27/12 122 lb (55.339 kg)   Constitutional: She appears well-developed and well-nourished. No distress.  HENT: Head: Normocephalic and atraumatic. Ears: B TMs ok, no erythema or  effusion; Nose: Nose normal. Mouth/Throat: Oropharynx is clear and moist. No oropharyngeal exudate.  Eyes: Conjunctivae and EOM are normal. Pupils are equal, round, and reactive to light. No scleral icterus.  Neck: Normal range of motion. Neck supple. No JVD present. No thyromegaly present.  Cardiovascular: Normal rate, regular rhythm and normal heart sounds.  No murmur heard. No BLE edema. Pulmonary/Chest: Effort normal and breath sounds normal. No respiratory distress. She has no wheezes.  Abdominal: Soft. Bowel sounds are normal. She exhibits no distension. There is no tenderness. no masses GU/breast: defer to gyn Musculoskeletal: Normal range of motion, no joint effusions. No gross deformities Neurological: She is alert and oriented to person, place, and time. No cranial nerve deficit. Coordination, balance, strength, speech and gait are normal.  Skin: Skin is warm and dry. No rash noted. No erythema.  Psychiatric: She has a normal mood and affect. Her behavior is normal. Judgment and thought content normal.    Lab Results  Component Value Date   WBC 6.6 07/25/2012   HGB 13.6 07/25/2012   HCT 40.6 07/25/2012   PLT 257.0 07/25/2012   GLUCOSE 147* 07/25/2012   CHOL 155 07/25/2012   TRIG 25.0 07/25/2012   HDL 65.60 07/25/2012   LDLCALC 84 07/25/2012   ALT 15 07/25/2012   AST 19 07/25/2012   NA 133* 07/25/2012   K 3.7 07/25/2012   CL 100 07/25/2012   CREATININE 0.9 07/25/2012   BUN 12  07/25/2012   CO2 25 07/25/2012   TSH 1.35 07/25/2012    US Breast Left  08/11/2012   *RADIOLOGY REPORT*  Clinical Data:  It was recommended that the patient return for short interval follow-up of a probably benign nodule in the 11 o'clock position of the left breast.  The patient did not do so. She no longer feels a mass in the left breast. She had a benign stereotactic biopsy of calcifications in the right upper outer quadrant in 2002 and a benign excisional biopsy in the left upper inner quadrant and 2003.  DIGITAL DIAGNOSTIC  BILATERAL MAMMOGRAM WITH CAD AND LEFT BREAST ULTRASOUND:  Comparison:  08/21/2007 from [facility].  01/16/2001 from [facility].  Findings:  ACR Breast Density Category 3: The breast tissue is heterogeneously dense.  There is no suspicious dominant mass, architectural distortion or calcification to suggest malignancy.  There are partially obscured partially circumscribed subcentimeter nodules in the left subareolar region.  Mammographic images were processed with CAD.  On physical exam, no mass is palpated in the left subareolar region.  Ultrasound is performed, showing scattered subcentimeter nonpalpable cysts in the left subareolar region.  Previously noted area at 11 o'clock is no longer visualized.  IMPRESSION: No mammographic or sonographic evidence of malignancy.  RECOMMENDATION: Yearly screening mammography is suggested.  I have discussed the findings and recommendations with the patient. Results were also provided in writing at the conclusion of the visit.  If applicable, a reminder letter will be sent to the patient regarding the next appointment.  BI-RADS CATEGORY 1:  Negative.   Original Report Authenticated By: Cain Saupe, M.D.   Mm Digital Diagnostic Bilat  08/11/2012   *RADIOLOGY REPORT*  Clinical Data:  It was recommended that the patient return for short interval follow-up of a probably benign nodule in the 11 o'clock position of the left breast.  The patient did not do so. She no longer feels a mass in the left breast. She had a benign stereotactic biopsy of calcifications in the right upper outer quadrant in 2002 and a benign excisional biopsy in the left upper inner quadrant and 2003.  DIGITAL DIAGNOSTIC BILATERAL MAMMOGRAM WITH CAD AND LEFT BREAST ULTRASOUND:  Comparison:  08/21/2007 from [facility].  01/16/2001 from [facility].  Findings:  ACR Breast Density Category 3: The breast tissue is heterogeneously  dense.  There is no suspicious dominant mass, architectural distortion or calcification to suggest malignancy.  There are partially obscured partially circumscribed subcentimeter nodules in the left subareolar region.  Mammographic images were processed with CAD.  On physical exam, no mass is palpated in the left subareolar region.  Ultrasound is performed, showing scattered subcentimeter nonpalpable cysts in the left subareolar region.  Previously noted area at 11 o'clock is no longer visualized.  IMPRESSION: No mammographic or sonographic evidence of malignancy.  RECOMMENDATION: Yearly screening mammography is suggested.  I have discussed the findings and recommendations with the patient. Results were also provided in writing at the conclusion of the visit.  If applicable, a reminder letter will be sent to the patient regarding the next appointment.  BI-RADS CATEGORY 1:  Negative.   Original Report Authenticated By: Cain Saupe, M.D.       Assessment & Plan:   CPX/v70.0 - Patient has been counseled on age-appropriate routine health concerns for screening and prevention. These are reviewed and up-to-date. Immunizations are up-to-date or declined. Labs ordered and reviewed.

## 2014-01-12 NOTE — Progress Notes (Signed)
Pre visit review using our clinic review tool, if applicable. No additional management support is needed unless otherwise documented below in the visit note.   Cologuard form has been sign by MD and pt and has been faxed to cologuard for verification of insurance benefits.  -Best boy, CMA

## 2014-01-19 LAB — FECAL OCCULT BLOOD, GUAIAC: Fecal Occult Blood: NEGATIVE

## 2014-01-30 ENCOUNTER — Encounter: Payer: Self-pay | Admitting: Internal Medicine

## 2014-06-02 ENCOUNTER — Telehealth: Payer: Self-pay | Admitting: Internal Medicine

## 2014-06-02 NOTE — Telephone Encounter (Signed)
Is requesting refill on adderall.  °

## 2014-06-03 ENCOUNTER — Other Ambulatory Visit: Payer: Self-pay

## 2014-06-03 NOTE — Telephone Encounter (Signed)
Spoke to Dr. Dorise HissKollar and she has declined the refill due to the last rx being in September and that was the last time the pt was seen.

## 2014-06-03 NOTE — Telephone Encounter (Signed)
Pt came in to check up on this request, pt stated she is going out of town tomorrow and request this to be call in or fax in to [facility] tonight if possible (she does not want to go out of town without this med). Please advise.

## 2014-06-09 ENCOUNTER — Ambulatory Visit: Payer: Self-pay | Admitting: Family

## 2014-06-09 ENCOUNTER — Other Ambulatory Visit: Payer: Self-pay

## 2014-06-09 ENCOUNTER — Telehealth: Payer: Self-pay | Admitting: Internal Medicine

## 2014-06-09 MED ORDER — AMPHETAMINE-DEXTROAMPHETAMINE 30 MG PO TABS: 30.0000 mg | ORAL_TABLET | Freq: Every day | ORAL | Status: DC

## 2014-06-09 NOTE — Telephone Encounter (Signed)
Spoke to Mariah Rodriguez. Mariah Rodriguez stated that she needs the adderrall refilled. Apologized for the delay in receiving the information about the refill being denied. Mariah Rodriguez requested appt. Appt made with Marcos EkeGreg Calone.

## 2014-06-09 NOTE — Telephone Encounter (Signed)
Spoke with pt PCP and was given verbal okay to print rx for adderrall. PCP will come in and sign. Pt informed rx will ready for pick up after 1pm.  Appt cancelled.

## 2014-06-09 NOTE — Telephone Encounter (Signed)
Pt called in and upset because no one has called her back about her meds.  She stated that she has been out of meds for a week.  She stated this is no acceptable and "better not happen anymore".  Explain to her the situation.  She is not happy about it.  Stefannie the best number is [phone]

## 2014-07-22 ENCOUNTER — Telehealth: Payer: Self-pay | Admitting: Internal Medicine

## 2014-07-22 MED ORDER — AMPHETAMINE-DEXTROAMPHETAMINE 30 MG PO TABS: 30.0000 mg | ORAL_TABLET | Freq: Every day | ORAL | Status: DC

## 2014-07-22 NOTE — Telephone Encounter (Signed)
LVM for pt to call back.   PCP will not be in until Monday and needed to know if rx needed to be pick up before then to coordinate with PCP to have it signed.

## 2014-07-22 NOTE — Telephone Encounter (Signed)
Patient called back and I read your note to her and she said Monday will be fine.

## 2014-07-22 NOTE — Telephone Encounter (Signed)
Pt called in need refill on her amphetamine-dextroamphetamine (ADDERALL) 30 MG .   She said just to call her on her home number when it is ready for pick up

## 2014-10-22 ENCOUNTER — Telehealth: Payer: Self-pay | Admitting: Internal Medicine

## 2014-10-22 NOTE — Telephone Encounter (Signed)
Called patient to see if a recent mammogram has been done left voicemail. ° °

## 2014-11-06 ENCOUNTER — Other Ambulatory Visit: Payer: Self-pay | Admitting: Internal Medicine

## 2014-12-09 ENCOUNTER — Telehealth: Payer: Self-pay | Admitting: *Deleted

## 2014-12-09 MED ORDER — AMPHETAMINE-DEXTROAMPHETAMINE 30 MG PO TABS: 30.0000 mg | ORAL_TABLET | Freq: Every day | ORAL | Status: DC

## 2014-12-09 NOTE — Telephone Encounter (Signed)
Pt rx printed and on Lucy's desk.

## 2014-12-09 NOTE — Telephone Encounter (Signed)
Left msg on triage requesting refill on her Adderral. MD out of office is this ok...[name]

## 2015-01-10 ENCOUNTER — Other Ambulatory Visit: Payer: Self-pay | Admitting: Obstetrics & Gynecology

## 2015-01-10 DIAGNOSIS — R928 Other abnormal and inconclusive findings on diagnostic imaging of breast: Secondary | ICD-10-CM

## 2015-01-11 ENCOUNTER — Other Ambulatory Visit: Payer: Self-pay | Admitting: Internal Medicine

## 2015-01-12 ENCOUNTER — Other Ambulatory Visit: Payer: Self-pay | Admitting: Obstetrics & Gynecology

## 2015-01-12 ENCOUNTER — Ambulatory Visit
Admission: RE | Admit: 2015-01-12 | Discharge: 2015-01-12 | Disposition: A | Discharge status: Home / Self Care | Payer: BLUE CROSS/BLUE SHIELD | Source: Ambulatory Visit | Attending: Obstetrics & Gynecology | Admitting: Obstetrics & Gynecology

## 2015-01-12 DIAGNOSIS — R928 Other abnormal and inconclusive findings on diagnostic imaging of breast: Secondary | ICD-10-CM

## 2015-01-14 LAB — HM MAMMOGRAPHY

## 2015-01-17 ENCOUNTER — Encounter: Payer: BC Managed Care – PPO | Admitting: Internal Medicine

## 2015-01-21 ENCOUNTER — Ambulatory Visit (INDEPENDENT_AMBULATORY_CARE_PROVIDER_SITE_OTHER): Payer: BLUE CROSS/BLUE SHIELD | Admitting: Internal Medicine

## 2015-01-21 ENCOUNTER — Encounter: Payer: Self-pay | Admitting: Internal Medicine

## 2015-01-21 VITALS — BP 110/72 | HR 78 | Temp 98.1°F | Ht 63.0 in | Wt 128.0 lb

## 2015-01-21 DIAGNOSIS — F988 Other specified behavioral and emotional disorders with onset usually occurring in childhood and adolescence: Secondary | ICD-10-CM

## 2015-01-21 DIAGNOSIS — F909 Attention-deficit hyperactivity disorder, unspecified type: Secondary | ICD-10-CM | POA: Diagnosis not present

## 2015-01-21 DIAGNOSIS — F411 Generalized anxiety disorder: Secondary | ICD-10-CM | POA: Diagnosis not present

## 2015-01-21 DIAGNOSIS — Z23 Encounter for immunization: Secondary | ICD-10-CM

## 2015-01-21 MED ORDER — AMPHETAMINE-DEXTROAMPHETAMINE 30 MG PO TABS: 30.0000 mg | ORAL_TABLET | Freq: Every day | ORAL | Status: DC

## 2015-01-21 MED ORDER — SERTRALINE HCL 50 MG PO TABS: 50.0000 mg | ORAL_TABLET | Freq: Every day | ORAL | Status: DC

## 2015-01-21 MED ORDER — FLUOXETINE HCL 20 MG PO TABS: 20.0000 mg | ORAL_TABLET | Freq: Every day | ORAL | Status: DC

## 2015-01-21 NOTE — Progress Notes (Signed)
Pre visit review using our clinic review tool, if applicable. No additional management support is needed unless otherwise documented below in the visit note. 

## 2015-01-21 NOTE — Assessment & Plan Note (Signed)
Increased anxiety recently and is interested in a change in medication Taper off prozac - she will take 20 mg daily for two weeks and if no side effects she will stop the medication.   She will then start the zoloft.   She will call with any questions or concerns

## 2015-01-21 NOTE — Assessment & Plan Note (Addendum)
Stable, taking medication as prescribed without side effects Needs refill - refill given today for current dose

## 2015-01-21 NOTE — Progress Notes (Signed)
   Subjective:    Patient ID: Mariah Rodriguez, female    DOB: 1960/06/18, 54 y.o.   MRN: [id number]  HPI ADD:  She takes her medication as prescribed.  On occasion she will not take the medication, especially on the weekends.  She denies side effects.  She feels the medication works well and she is happy with her current dose.   Depression, anxiety:  She has been on prozac for years.  Recently she has had increased anxiety and she wonders if she should switch back to zoloft, which she was on previously and she felt it helped better with anxiety.  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   She has spoken out a couple of times in court and is concerned about losing her job. She does not feel depressed.    She is no longer taking the trazodone and has been sleeping well for the most part.    Review of Systems  Constitutional: Negative for appetite change and unexpected weight change.  Respiratory: Negative for shortness of breath.   Cardiovascular: Negative for chest pain and palpitations.  Neurological: Negative for light-headedness and headaches.  Psychiatric/Behavioral: Positive for dysphoric mood. The patient is nervous/anxious.        Objective:   Physical Exam  Constitutional: She is oriented to person, place, and time. She appears well-developed and well-nourished. No distress.  Neurological: She is alert and oriented to person, place, and time.  Psychiatric: Her behavior is normal. Judgment and thought content normal.  Slightly anxious     Filed Vitals:   01/21/15 0958  BP: 110/72  Pulse: 78  Temp: 98.1 F (36.7 C)        Assessment & Plan:   Flu shot given today

## 2015-01-21 NOTE — Patient Instructions (Signed)
Medications reviewed and updated.  Changes include tapering off the prozac and starting zoloft once you are off the prozac.  Your prescription(s) have been submitted to your pharmacy. Please take as directed and contact our office if you believe you are having problem(s) with the medication(s).  Adderall refilled today.   You received the flu shot

## 2015-01-24 ENCOUNTER — Other Ambulatory Visit: Payer: Self-pay | Admitting: Obstetrics & Gynecology

## 2015-01-24 ENCOUNTER — Ambulatory Visit
Admission: RE | Admit: 2015-01-24 | Discharge: 2015-01-24 | Disposition: A | Discharge status: Home / Self Care | Payer: BLUE CROSS/BLUE SHIELD | Source: Ambulatory Visit | Attending: Obstetrics & Gynecology | Admitting: Obstetrics & Gynecology

## 2015-01-24 DIAGNOSIS — N6002 Solitary cyst of left breast: Secondary | ICD-10-CM

## 2015-01-24 DIAGNOSIS — R928 Other abnormal and inconclusive findings on diagnostic imaging of breast: Secondary | ICD-10-CM

## 2015-03-03 ENCOUNTER — Other Ambulatory Visit: Payer: Self-pay | Admitting: Internal Medicine

## 2015-03-03 ENCOUNTER — Encounter: Payer: Self-pay | Admitting: Internal Medicine

## 2015-03-04 ENCOUNTER — Other Ambulatory Visit: Payer: Self-pay

## 2015-03-07 MED ORDER — SERTRALINE HCL 50 MG PO TABS: 50.0000 mg | ORAL_TABLET | Freq: Every day | ORAL | Status: DC

## 2015-03-07 MED ORDER — AMPHETAMINE-DEXTROAMPHETAMINE 30 MG PO TABS: 30.0000 mg | ORAL_TABLET | Freq: Every day | ORAL | Status: DC

## 2015-03-07 NOTE — Addendum Note (Signed)
Addended by: Verlan FriendsAIRRIKIER DAVIDSON, Cristobal Advani M on: 03/07/2015 08:25 AM   Modules accepted: Orders

## 2015-03-16 ENCOUNTER — Other Ambulatory Visit: Payer: Self-pay

## 2015-03-16 MED ORDER — FLUOXETINE HCL 40 MG PO CAPS: 40.0000 mg | ORAL_CAPSULE | Freq: Every day | ORAL | Status: DC

## 2015-03-16 NOTE — Telephone Encounter (Signed)
Pt called in today. She is very upset that she has not had her prozac filled. She was taken off of prozac and started on zoloft on 12/2014. Pt stated that she only took the zoloft for 1 week and did not like how it made her feel. She has been taking the prozac that she had from a previous fill and now wants a refill of prozac. The last dosage she was on was 50 mg daily (dosage prior to the taper dose).   Is it okay to make this active on her med list again?   Morton PetersGreg okayed via verbal

## 2015-04-22 ENCOUNTER — Telehealth: Payer: Self-pay | Admitting: *Deleted

## 2015-04-22 MED ORDER — AMPHETAMINE-DEXTROAMPHETAMINE 30 MG PO TABS: 30.0000 mg | ORAL_TABLET | Freq: Every day | ORAL | Status: DC

## 2015-04-22 NOTE — Telephone Encounter (Signed)
printed

## 2015-04-22 NOTE — Telephone Encounter (Signed)
Left msg on triage requesting refill on her Adderral.../lmb

## 2015-04-22 NOTE — Telephone Encounter (Signed)
Called pt no answer LMOM rx ready for pick-up.../lmb 

## 2015-05-11 ENCOUNTER — Ambulatory Visit (INDEPENDENT_AMBULATORY_CARE_PROVIDER_SITE_OTHER): Payer: 59 | Admitting: Nurse Practitioner

## 2015-05-11 ENCOUNTER — Encounter: Payer: Self-pay | Admitting: Nurse Practitioner

## 2015-05-11 VITALS — BP 122/82 | HR 79 | Temp 98.7°F | Resp 14 | Ht 64.0 in | Wt 134.0 lb

## 2015-05-11 DIAGNOSIS — Z20828 Contact with and (suspected) exposure to other viral communicable diseases: Secondary | ICD-10-CM | POA: Diagnosis not present

## 2015-05-11 LAB — POCT INFLUENZA A/B
INFLUENZA B, POC: NEGATIVE
Influenza A, POC: NEGATIVE

## 2015-05-11 MED ORDER — ONDANSETRON 4 MG PO TBDP: 4.0000 mg | ORAL_TABLET | Freq: Three times a day (TID) | ORAL | Status: DC | PRN

## 2015-05-11 MED ORDER — OSELTAMIVIR PHOSPHATE 75 MG PO CAPS: 75.0000 mg | ORAL_CAPSULE | Freq: Two times a day (BID) | ORAL | Status: DC

## 2015-05-11 NOTE — Progress Notes (Signed)
Patient ID: Mariah Rodriguez, female    DOB: 07/17/1960  Age: 55 y.o. MRN: [id number]  CC: Acute Visit   HPI Mariah Rodriguez presents for CC of myalgias, HA, cough, Congestion x 1 day.  1) Sick contacts- Wife diagnosed today with Flu at another facility Treatment to date: None Symptoms per pt include HA, Cough, congestion, body aches x 1 day Denies GI symptoms at this time.  Reports wife tested negative at first then was positive  History Mariah Rodriguez has a past medical history of Postconcussion syndrome (05/2008); DEPRESSION; ADD; PERIMENOPAUSAL SYNDROME; Unspecified vitamin D deficiency; Cervicalgia; GERD (gastroesophageal reflux disease); and Injury of left index finger (10/2013).   She has past surgical history that includes Cholecystectomy (1985) and Breast surgery (2000).   Her family history includes Hypertension in her maternal grandmother, mother, and sister; Liver cancer (age of onset: 32) in her father; Osteoarthritis in her mother; Polymyalgia rheumatica in her mother.She reports that she has never smoked. She has never used smokeless tobacco. She reports that she drinks alcohol. She reports that she does not use illicit drugs.  Outpatient Prescriptions Prior to Visit  Medication Sig Dispense Refill  . amphetamine-dextroamphetamine (ADDERALL) 30 MG tablet Take 1 tablet by mouth daily. 30 tablet 0  . estradiol-norethindrone (COMBIPATCH) 0.05-0.14 MG/DAY Place 1 patch onto the skin 2 (two) times a week.    . finasteride (PROSCAR) 5 MG tablet Take 0.5 tablets (2.5 mg total) by mouth daily.    Marland Kitchen FLUoxetine (PROZAC) 40 MG capsule Take 1 capsule (40 mg total) by mouth daily. 90 capsule 3  . sertraline (ZOLOFT) 50 MG tablet Take 1 tablet (50 mg total) by mouth daily. (Patient not taking: Reported on 05/11/2015) 90 tablet 1   No facility-administered medications prior to visit.    ROS Review of Systems  Constitutional: Negative for fever, chills, diaphoresis and fatigue.  Respiratory:  Positive for cough. Negative for chest tightness, shortness of breath and wheezing.   Cardiovascular: Negative for chest pain, palpitations and leg swelling.  Gastrointestinal: Negative for nausea, vomiting and diarrhea.  Skin: Negative for rash.  Neurological: Negative for dizziness, weakness, numbness and headaches.  Psychiatric/Behavioral: The patient is not nervous/anxious.     Objective:  BP 122/82 mmHg  Pulse 79  Temp(Src) 98.7 F (37.1 C) (Oral)  Resp 14  Ht  (1.626 m)  Wt 134 lb (60.782 kg)  BMI 22.99 kg/m2  SpO2 98%  Physical Exam  Constitutional: She is oriented to person, place, and time. She appears well-developed and well-nourished. No distress.  HENT:  Head: Normocephalic and atraumatic.  Right Ear: External ear normal.  Left Ear: External ear normal.  Mouth/Throat: No oropharyngeal exudate.  Neck: Normal range of motion. Neck supple.  Cardiovascular: Normal rate, regular rhythm and normal heart sounds.  Exam reveals no gallop and no friction rub.   No murmur heard. Pulmonary/Chest: Effort normal and breath sounds normal. No respiratory distress. She has no wheezes. She has no rales. She exhibits no tenderness.  Lymphadenopathy:    She has no cervical adenopathy.  Neurological: She is alert and oriented to person, place, and time. No cranial nerve deficit. She exhibits normal muscle tone. Coordination normal.  Skin: Skin is warm and dry. No rash noted. She is not diaphoretic.  Psychiatric: She has a normal mood and affect. Her behavior is normal. Judgment and thought content normal.   Assessment & Plan:   Mariah Rodriguez was seen today for acute visit.  Diagnoses and all orders for this visit:  Exposure to the flu -     POCT Influenza A/B  Other orders -     oseltamivir (TAMIFLU) 75 MG capsule; Take 1 capsule (75 mg total) by mouth 2 (two) times daily. -     ondansetron (ZOFRAN ODT) 4 MG disintegrating tablet; Take 1 tablet (4 mg total) by mouth every 8  (eight) hours as needed for nausea or vomiting.   I am having Mariah Rodriguez start on oseltamivir and ondansetron. I am also having her maintain her estradiol-norethindrone, finasteride, sertraline, FLUoxetine, and amphetamine-dextroamphetamine.  Meds ordered this encounter  Medications  . oseltamivir (TAMIFLU) 75 MG capsule    Sig: Take 1 capsule (75 mg total) by mouth 2 (two) times daily.    Dispense:  10 capsule    Refill:  0    Order Specific Question:  Supervising Provider    Answer:  Duncan Dull L [[id number]]  . ondansetron (ZOFRAN ODT) 4 MG disintegrating tablet    Sig: Take 1 tablet (4 mg total) by mouth every 8 (eight) hours as needed for nausea or vomiting.    Dispense:  20 tablet    Refill:  0    Order Specific Question:  Supervising Provider    Answer:  Sherlene Shams [[id number]]     Follow-up: Return if symptoms worsen or fail to improve.

## 2015-05-11 NOTE — Patient Instructions (Signed)
Tamiflu twice daily for 5 days Zofran under the tongue to melt if nausea/vomiting begins

## 2015-05-11 NOTE — Assessment & Plan Note (Signed)
New onset POCT influenza A/B Negative  Symptoms have already started within the 48 hr window Tamiflu 75 mg twice daily x 5 days sent to pharmacy Zofran ODT 4 mg prn for GI symptoms if they begin  When to seek emergency care discussed- wife is a PA FU prn worsening/failure to improve.

## 2015-05-11 NOTE — Progress Notes (Signed)
Pre visit review using our clinic review tool, if applicable. No additional management support is needed unless otherwise documented below in the visit note. 

## 2015-06-09 ENCOUNTER — Telehealth: Payer: Self-pay | Admitting: *Deleted

## 2015-06-09 MED ORDER — AMPHETAMINE-DEXTROAMPHETAMINE 30 MG PO TABS: 30.0000 mg | ORAL_TABLET | Freq: Every day | ORAL | Status: DC

## 2015-06-09 NOTE — Telephone Encounter (Signed)
Printed and signed.  

## 2015-06-09 NOTE — Telephone Encounter (Signed)
Requesting refill on her adderral. MD out of office this week pls advise...Mariah Rodriguez

## 2015-06-09 NOTE — Telephone Encounter (Signed)
Notified pt rx ready for pick-up.../lmb 

## 2015-07-22 ENCOUNTER — Telehealth: Payer: Self-pay | Admitting: Internal Medicine

## 2015-07-22 NOTE — Telephone Encounter (Signed)
Left msg on triage requesting rfill on her Adderrall.Mariah Rodriguez.[name]/lmb

## 2015-07-24 MED ORDER — AMPHETAMINE-DEXTROAMPHETAMINE 30 MG PO TABS: 30.0000 mg | ORAL_TABLET | Freq: Every day | ORAL | Status: DC

## 2015-07-24 NOTE — Addendum Note (Signed)
Addended by: Pincus SanesBURNS, Santhosh Gulino J on: 07/24/2015 01:57 PM   Modules accepted: Orders

## 2015-07-24 NOTE — Telephone Encounter (Signed)
rx printed

## 2015-07-25 ENCOUNTER — Encounter: Payer: Self-pay | Admitting: Internal Medicine

## 2015-07-25 NOTE — Telephone Encounter (Signed)
Called pt no answer LMOM rx ready for pick-up.../lmb 

## 2015-09-09 ENCOUNTER — Telehealth: Payer: Self-pay | Admitting: *Deleted

## 2015-09-09 MED ORDER — AMPHETAMINE-DEXTROAMPHETAMINE 30 MG PO TABS: 30.0000 mg | ORAL_TABLET | Freq: Every day | ORAL | Status: DC

## 2015-09-09 NOTE — Telephone Encounter (Signed)
Notified pt rx ready for pick-up.../lmb 

## 2015-09-09 NOTE — Telephone Encounter (Signed)
Requesting refill on her Adderrall.../lmb 

## 2015-09-09 NOTE — Telephone Encounter (Signed)
printed

## 2015-10-24 ENCOUNTER — Telehealth: Payer: Self-pay | Admitting: *Deleted

## 2015-10-24 MED ORDER — AMPHETAMINE-DEXTROAMPHETAMINE 30 MG PO TABS: 30.0000 mg | ORAL_TABLET | Freq: Every day | ORAL | Status: DC

## 2015-10-24 NOTE — Telephone Encounter (Signed)
Please UDS with pickup. No recent visit with provider and no future visits. Printed and signed. Please remind to schedule physical with PCP

## 2015-10-24 NOTE — Telephone Encounter (Signed)
Notified pt rx ready for pick-up.../lmb 

## 2015-10-24 NOTE — Telephone Encounter (Signed)
Left msg on triage requesting refill on her Adderrall.. MD is out of the office pls adivise on refill...[name]/lmb

## 2015-12-01 ENCOUNTER — Other Ambulatory Visit: Payer: Self-pay | Admitting: Internal Medicine

## 2015-12-01 ENCOUNTER — Other Ambulatory Visit: Payer: Self-pay | Admitting: *Deleted

## 2015-12-01 MED ORDER — AMPHETAMINE-DEXTROAMPHETAMINE 30 MG PO TABS: 30.0000 mg | ORAL_TABLET | Freq: Every day | ORAL | 0 refills | Status: DC

## 2015-12-01 NOTE — Telephone Encounter (Signed)
Req refill on her Adderral MD is out of the office pls advise...[name]/lmb

## 2015-12-01 NOTE — Telephone Encounter (Signed)
DONE

## 2015-12-02 NOTE — Telephone Encounter (Signed)
lvm informing rx is ready for pick up.

## 2016-01-25 ENCOUNTER — Telehealth: Payer: Self-pay | Admitting: *Deleted

## 2016-01-25 ENCOUNTER — Other Ambulatory Visit: Payer: Self-pay | Admitting: Family

## 2016-01-25 MED ORDER — AMPHETAMINE-DEXTROAMPHETAMINE 30 MG PO TABS: 30.0000 mg | ORAL_TABLET | Freq: Every day | ORAL | 0 refills | Status: DC

## 2016-01-25 NOTE — Telephone Encounter (Signed)
ok 

## 2016-01-25 NOTE — Telephone Encounter (Signed)
Pt left msg on triage requesting refill on her Adderral.../lmb

## 2016-01-25 NOTE — Telephone Encounter (Signed)
Notified pt rx ready for pick-up.../lmb 

## 2016-03-07 ENCOUNTER — Encounter: Payer: Self-pay | Admitting: Internal Medicine

## 2016-03-07 DIAGNOSIS — Z0289 Encounter for other administrative examinations: Secondary | ICD-10-CM

## 2016-03-12 ENCOUNTER — Other Ambulatory Visit: Payer: Self-pay | Admitting: Internal Medicine

## 2016-03-13 ENCOUNTER — Ambulatory Visit (INDEPENDENT_AMBULATORY_CARE_PROVIDER_SITE_OTHER): Payer: 59 | Admitting: Nurse Practitioner

## 2016-03-13 ENCOUNTER — Encounter: Payer: Self-pay | Admitting: Nurse Practitioner

## 2016-03-13 DIAGNOSIS — F411 Generalized anxiety disorder: Secondary | ICD-10-CM | POA: Diagnosis not present

## 2016-03-13 DIAGNOSIS — J069 Acute upper respiratory infection, unspecified: Secondary | ICD-10-CM

## 2016-03-13 DIAGNOSIS — Z23 Encounter for immunization: Secondary | ICD-10-CM

## 2016-03-13 MED ORDER — FLUTICASONE PROPIONATE 50 MCG/ACT NA SUSP: 2.0000 | Freq: Every day | NASAL | 0 refills | Status: DC

## 2016-03-13 MED ORDER — GUAIFENESIN ER 600 MG PO TB12: 600.0000 mg | ORAL_TABLET | Freq: Two times a day (BID) | ORAL | 0 refills | Status: DC | PRN

## 2016-03-13 MED ORDER — FLUOXETINE HCL 40 MG PO CAPS: 40.0000 mg | ORAL_CAPSULE | Freq: Every day | ORAL | 3 refills | Status: DC

## 2016-03-13 MED ORDER — CETIRIZINE HCL 10 MG PO TABS: 10.0000 mg | ORAL_TABLET | Freq: Every day | ORAL | 0 refills | Status: DC

## 2016-03-13 MED ORDER — OXYMETAZOLINE HCL 0.05 % NA SOLN: 1.0000 | Freq: Two times a day (BID) | NASAL | 0 refills | Status: DC

## 2016-03-13 NOTE — Progress Notes (Signed)
Subjective:  Patient ID: Mariah Rodriguez, female    DOB: February 21, 1961  Age: 55 y.o. MRN: [id number]  CC: URI (left ear pain xs 2 days, sinus pain)   URI   This is a new problem. The current episode started in the past 7 days. The problem has been unchanged. There has been no fever. Associated symptoms include congestion, coughing, ear pain, headaches, a plugged ear sensation, rhinorrhea, sinus pain and a sore throat. Pertinent negatives include no chest pain, joint pain, nausea, swollen glands or wheezing. She has tried nothing for the symptoms.   She will also like prozac refill.  Outpatient Medications Prior to Visit  Medication Sig Dispense Refill  . amphetamine-dextroamphetamine (ADDERALL) 30 MG tablet Take 1 tablet by mouth daily. 30 tablet 0  . estradiol-norethindrone (COMBIPATCH) 0.05-0.14 MG/DAY Place 1 patch onto the skin 2 (two) times a week.    . finasteride (PROSCAR) 5 MG tablet Take 0.5 tablets (2.5 mg total) by mouth daily.    Marland Kitchen FLUoxetine (PROZAC) 40 MG capsule Take 1 capsule (40 mg total) by mouth daily. Overdue for yearly physical must see MD for future refills 30 capsule 0  . ondansetron (ZOFRAN ODT) 4 MG disintegrating tablet Take 1 tablet (4 mg total) by mouth every 8 (eight) hours as needed for nausea or vomiting. 20 tablet 0  . oseltamivir (TAMIFLU) 75 MG capsule Take 1 capsule (75 mg total) by mouth 2 (two) times daily. 10 capsule 0  . sertraline (ZOLOFT) 50 MG tablet Take 1 tablet (50 mg total) by mouth daily. (Patient not taking: Reported on 05/11/2015) 90 tablet 1   No facility-administered medications prior to visit.     ROS See HPI  Objective:  BP 134/86   Pulse 76   Temp 98.2 F (36.8 C) (Oral)   Wt 139 lb (63 kg)   SpO2 99%   BMI 23.86 kg/m   BP Readings from Last 3 Encounters:  03/13/16 134/86  05/11/15 122/82  01/21/15 110/72    Wt Readings from Last 3 Encounters:  03/13/16 139 lb (63 kg)  05/11/15 134 lb (60.8 kg)  01/21/15 128 lb (58.1 kg)     Physical Exam  Constitutional: She is oriented to person, place, and time. No distress.  HENT:  Right Ear: No mastoid tenderness. Tympanic membrane is not injected and not erythematous. A middle ear effusion is present.  Left Ear: No mastoid tenderness. Tympanic membrane is not injected and not erythematous. A middle ear effusion is present.  Nose: Mucosal edema and rhinorrhea present. Right sinus exhibits no maxillary sinus tenderness and no frontal sinus tenderness. Left sinus exhibits no maxillary sinus tenderness and no frontal sinus tenderness.  Mouth/Throat: Uvula is midline. Posterior oropharyngeal erythema present. No oropharyngeal exudate.  Neck: Normal range of motion. Neck supple.  Cardiovascular: Normal rate and normal heart sounds.   Pulmonary/Chest: Effort normal and breath sounds normal.  Musculoskeletal: She exhibits no edema.  Lymphadenopathy:    She has no cervical adenopathy.  Neurological: She is alert and oriented to person, place, and time.  Skin: Skin is warm and dry.  Vitals reviewed.   Lab Results  Component Value Date   WBC 7.4 01/12/2014   HGB 14.3 01/12/2014   HCT 42.6 01/12/2014   PLT 250.0 01/12/2014   GLUCOSE 91 01/12/2014   CHOL 166 01/12/2014   TRIG 51.0 01/12/2014   HDL 68.90 01/12/2014   LDLCALC 87 01/12/2014   ALT 20 01/12/2014   AST 18 01/12/2014   NA  136 01/12/2014   K 4.4 01/12/2014   CL 103 01/12/2014   CREATININE 0.8 01/12/2014   BUN 12 01/12/2014   CO2 27 01/12/2014   TSH 1.25 01/12/2014    [place] Aspiration  Result Date: 01/24/2015 CLINICAL DATA:  Patient returns on 01/24/2015 for possible biopsy of mass in left breast. EXAM: ULTRASOUND GUIDED LEFT BREAST CYST ASPIRATION COMPARISON:  Previous exams. PROCEDURE: Sonographic evaluation of the left breast was performed showing a cluster of near anechoic cyst in the left breast at 3 o'clock 1 cm from the nipple. 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 Using sterile  technique, 2% lidocaine, under direct ultrasound visualization, needle aspiration of this cyst in the left breast at 3 o'clock 1 cm from the nipple was performed. The cyst decompressed with aspiration. Scant clear fluid was obtained. IMPRESSION: Ultrasound-guided aspiration of cyst in the 3 o'clock region of the left breast peer No apparent complications. RECOMMENDATIONS: Short-term interval followup left mammogram and ultrasound in 6 months is recommended. Electronically Signed   By: Baird Lyonsina  Arceo M.D.   On: 01/24/2015 15:59    Assessment & Plan:   Lurena JoinerRebecca was seen today for uri.  Diagnoses and all orders for this visit:  Acute URI -     fluticasone (FLONASE) 50 MCG/ACT nasal spray; Place 2 sprays into both nostrils daily. -     oxymetazoline (AFRIN NASAL SPRAY) 0.05 % nasal spray; Place 1 spray into both nostrils 2 (two) times daily. Use only for 3days, then stop -     guaiFENesin (MUCINEX) 600 MG 12 hr tablet; Take 1 tablet (600 mg total) by mouth 2 (two) times daily as needed for cough or to loosen phlegm. -     cetirizine (ZYRTEC) 10 MG tablet; Take 1 tablet (10 mg total) by mouth daily.  Generalized anxiety disorder -     FLUoxetine (PROZAC) 40 MG capsule; Take 1 capsule (40 mg total) by mouth daily. Overdue for yearly physical must see MD for future refills  Other orders -     Flu Vaccine QUAD 36+ mos IM   I have discontinued Ms. Ingerson's sertraline, oseltamivir, and ondansetron. I am also having her start on fluticasone, oxymetazoline, guaiFENesin, and cetirizine. Additionally, I am having her maintain her estradiol-norethindrone, finasteride, amphetamine-dextroamphetamine, and FLUoxetine.  Meds ordered this encounter  Medications  . FLUoxetine (PROZAC) 40 MG capsule    Sig: Take 1 capsule (40 mg total) by mouth daily. Overdue for yearly physical must see MD for future refills    Dispense:  30 capsule    Refill:  3    THANKS!    Order Specific Question:   Supervising Provider     Answer:   Tresa GarterPLOTNIKOV, ALEKSEI V [[id number]]  . fluticasone (FLONASE) 50 MCG/ACT nasal spray    Sig: Place 2 sprays into both nostrils daily.    Dispense:  16 g    Refill:  0    Order Specific Question:   Supervising Provider    Answer:   Tresa GarterPLOTNIKOV, ALEKSEI V [[id number]]  . oxymetazoline (AFRIN NASAL SPRAY) 0.05 % nasal spray    Sig: Place 1 spray into both nostrils 2 (two) times daily. Use only for 3days, then stop    Dispense:  30 mL    Refill:  0    Order Specific Question:   Supervising Provider    Answer:   Tresa GarterPLOTNIKOV, ALEKSEI V [[id number]]  . guaiFENesin (MUCINEX) 600 MG 12 hr tablet    Sig: Take 1 tablet (600  mg total) by mouth 2 (two) times daily as needed for cough or to loosen phlegm.    Dispense:  14 tablet    Refill:  0    Order Specific Question:   Supervising Provider    Answer:   Tresa GarterPLOTNIKOV, ALEKSEI V [[id number]]  . cetirizine (ZYRTEC) 10 MG tablet    Sig: Take 1 tablet (10 mg total) by mouth daily.    Dispense:  30 tablet    Refill:  0    Order Specific Question:   Supervising Provider    Answer:   Tresa GarterPLOTNIKOV, ALEKSEI V [[id number]]    Follow-up: No Follow-up on file.  Alysia Pennaharlotte Nayleen Janosik, NP

## 2016-03-13 NOTE — Patient Instructions (Signed)
URI Instructions: Flonase and Afrin use: apply 1spray of afrin in each nare, wait 5mins, then apply 2sprays of flonase in each nare. Use both nasal spray consecutively x 3days, then flonase only for at least 14days.  Use over-the-counter  "cold" medicines  such as "Tylenol cold" , "Advil cold",  "Mucinex" or" Mucinex D"  for cough and congestion.  Avoid decongestants if you have high blood pressure. Use" Delsym" or" Robitussin" cough syrup varietis for cough.  You can use plain "Tylenol" or "Advi"l for fever, chills and achyness.   "Common cold" symptoms are usually triggered by a virus.  The antibiotics are usually not necessary. On average, a" viral cold" illness would take 4-7 days to resolve. Please, make an appointment if you are not better or if you're worse.  

## 2016-03-13 NOTE — Progress Notes (Signed)
Pre visit review using our clinic review tool, if applicable. No additional management support is needed unless otherwise documented below in the visit note. 

## 2016-04-04 ENCOUNTER — Ambulatory Visit (INDEPENDENT_AMBULATORY_CARE_PROVIDER_SITE_OTHER): Payer: 59 | Admitting: Internal Medicine

## 2016-04-04 ENCOUNTER — Encounter: Payer: Self-pay | Admitting: Internal Medicine

## 2016-04-04 DIAGNOSIS — F411 Generalized anxiety disorder: Secondary | ICD-10-CM | POA: Diagnosis not present

## 2016-04-04 DIAGNOSIS — F988 Other specified behavioral and emotional disorders with onset usually occurring in childhood and adolescence: Secondary | ICD-10-CM | POA: Diagnosis not present

## 2016-04-04 MED ORDER — AMPHETAMINE-DEXTROAMPHETAMINE 30 MG PO TABS: 30.0000 mg | ORAL_TABLET | Freq: Every day | ORAL | 0 refills | Status: DC

## 2016-04-04 NOTE — Progress Notes (Signed)
Subjective:    Patient ID: Mariah Rodriguez, female    DOB: [birth year], 55 y.o.   MRN: [id number]  HPI The patient is here for follow up.  Depression, anxiety : She is taking her medication daily as prescribed. She denies any side effects from the medication. She feels her depression and anxiety are well controlled and she is happy with her current dose of medication.   ADD:  She is taking her medication as prescribed.  She feels the medication is effective.  She denies side effects, including palpitations, headaches, lightheadedness, decreased appetite and weight loss.    Medications and allergies reviewed with patient and updated if appropriate.  Patient Active Problem List   Diagnosis Date Noted  . Exposure to the flu 05/11/2015  . Generalized anxiety disorder 01/21/2015  . GERD (gastroesophageal reflux disease)   . CERVICALGIA 06/30/2010  . Postmenopausal symptoms   . DRY MOUTH 10/11/2009  . DEPRESSION 12/02/2008  . 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 TREMOR 12/02/2008  . UNSPECIFIED VITAMIN D DEFICIENCY 10/26/2008  . POSTCONCUSSION SYNDROME 10/21/2008  . Attention deficit disorder 10/21/2008    Current Outpatient Prescriptions on File Prior to Visit  Medication Sig Dispense Refill  . cetirizine (ZYRTEC) 10 MG tablet Take 1 tablet (10 mg total) by mouth daily. 30 tablet 0  . estradiol-norethindrone (COMBIPATCH) 0.05-0.14 MG/DAY Place 1 patch onto the skin 2 (two) times a week.    . finasteride (PROSCAR) 5 MG tablet Take 0.5 tablets (2.5 mg total) by mouth daily.    Marland Kitchen. FLUoxetine (PROZAC) 40 MG capsule Take 1 capsule (40 mg total) by mouth daily. Overdue for yearly physical must see MD for future refills 30 capsule 3  . fluticasone (FLONASE) 50 MCG/ACT nasal spray Place 2 sprays into both nostrils daily. 16 g 0  . guaiFENesin (MUCINEX) 600 MG 12 hr tablet Take 1 tablet (600 mg total) by mouth 2 (two) times daily as needed for cough or to loosen phlegm. 14 tablet 0   . oxymetazoline (AFRIN NASAL SPRAY) 0.05 % nasal spray Place 1 spray into both nostrils 2 (two) times daily. Use only for 3days, then stop 30 mL 0   No current facility-administered medications on file prior to visit.     Past Medical History:  Diagnosis Date  . ADD   . Cervicalgia   . DEPRESSION   . GERD (gastroesophageal reflux disease)   . Injury of left index finger 10/2013   fracture PIP  . PERIMENOPAUSAL SYNDROME   . Postconcussion syndrome 05/2008   frontal lobe contusion with CHI  . Unspecified vitamin D deficiency     Past Surgical History:  Procedure Laterality Date  . BREAST SURGERY  2000   Breast biopsy  . CHOLECYSTECTOMY  1985    Social History   Social History  . Marital status: Single    Spouse name: N/A  . Number of children: N/A  . Years of education: N/A   Social History Main Topics  . Smoking status: Never Smoker  . Smokeless tobacco: Never Used  . Alcohol use Yes     Comment: Social  . Drug use: No  . Sexual activity: Not Asked   Other Topics Concern  . None   Social History Narrative   Lives with wife Lupita LeashDonna and their 2 children. 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 Hypertension Sister   . Hypertension Mother   . Hypertension Maternal Grandmother   .  Osteoarthritis Mother   . Polymyalgia rheumatica Mother   . Liver cancer Father 40    Review of Systems     Objective:   Vitals:   04/04/16 1124  BP: 114/74  Pulse: 90  Resp: 16  Temp: 98.3 F (36.8 C)   Filed Weights   04/04/16 1124  Weight: 140 lb (63.5 kg)   Body mass index is 24.03 kg/m.   Physical Exam    Constitutional: Appears well-developed and well-nourished. No distress.  HENT:  Head: Normocephalic and atraumatic.  Skin: Skin is warm and dry. Not diaphoretic.  Psychiatric: Normal mood and affect. Behavior is normal.      Assessment & Plan:    See Problem List for Assessment and Plan of chronic medical problems.     F/u in 6 months

## 2016-04-04 NOTE — Progress Notes (Signed)
Pre visit review using our clinic review tool, if applicable. No additional management support is needed unless otherwise documented below in the visit note. 

## 2016-04-04 NOTE — Assessment & Plan Note (Signed)
Controlled, stable Continue current dose of medication  

## 2016-04-04 NOTE — Assessment & Plan Note (Signed)
Stable and controlled Happy with dose, no side effects Taking appropriately Refill given today

## 2016-04-04 NOTE — Patient Instructions (Signed)
  Medications reviewed and updated.  No changes recommended at this time.  Your prescription(s) have been submitted to your pharmacy. Please take as directed and contact our office if you believe you are having problem(s) with the medication(s).    Please followup in 6 months   

## 2016-05-03 ENCOUNTER — Telehealth: Payer: Self-pay | Admitting: Internal Medicine

## 2016-05-03 NOTE — Telephone Encounter (Signed)
Mother is positive for the flu and pt would like for us to call in tamaflu for her because she is her care giver out in [place] taking care of her right now   Pharmacy for this time only . [facility]  [address]  [place]  [phone]

## 2016-05-04 MED ORDER — OSELTAMIVIR PHOSPHATE 75 MG PO CAPS: 75.0000 mg | ORAL_CAPSULE | Freq: Every day | ORAL | 0 refills | Status: DC

## 2016-05-04 NOTE — Telephone Encounter (Signed)
Please advise 

## 2016-05-04 NOTE — Telephone Encounter (Signed)
sent 

## 2016-06-13 ENCOUNTER — Telehealth: Payer: Self-pay | Admitting: *Deleted

## 2016-06-13 MED ORDER — AMPHETAMINE-DEXTROAMPHETAMINE 30 MG PO TABS: 30.0000 mg | ORAL_TABLET | Freq: Every day | ORAL | 0 refills | Status: DC

## 2016-06-13 NOTE — Telephone Encounter (Signed)
Rec'd call pt requesting refill on her Adderrall. MD is out of the office pls advise on refill...[name]/lmb

## 2016-06-13 NOTE — Telephone Encounter (Signed)
Done hardcopy to anna 

## 2016-06-15 NOTE — Telephone Encounter (Signed)
Pt has been notified RX is ready for pick-up 

## 2016-07-27 MED FILL — OXYCODONE/APAP 5/325 MG TAB: 5-325 | 5 days supply | Qty: 20 | Fill #0

## 2016-07-27 MED FILL — CHLORHEXIDINE 0.12% RINSE: 0.12 | 16 days supply | Qty: 473 | Fill #0

## 2016-10-04 ENCOUNTER — Telehealth: Payer: Self-pay | Admitting: Internal Medicine

## 2016-10-04 MED ORDER — AMPHETAMINE-DEXTROAMPHETAMINE 30 MG PO TABS: 30.0000 mg | ORAL_TABLET | Freq: Every day | ORAL | 0 refills | Status: DC

## 2016-10-04 NOTE — Telephone Encounter (Signed)
Last refill 3/23, last written  06/13/16  Ok to refill.   Ok to f/u once a year

## 2016-10-04 NOTE — Telephone Encounter (Signed)
Pt notified rx is ready for pick-up. Will schedule follow-up when she comes to pick-up RX

## 2016-10-04 NOTE — Telephone Encounter (Signed)
Please advise, pt last OV 04/04/2016, last refill 06/13/2016

## 2016-10-04 NOTE — Telephone Encounter (Signed)
Pt would like a refill of amphetamine-dextroamphetamine (ADDERALL) 30 MG tablet

## 2016-10-23 ENCOUNTER — Ambulatory Visit (INDEPENDENT_AMBULATORY_CARE_PROVIDER_SITE_OTHER): Payer: 59 | Admitting: Internal Medicine

## 2016-10-23 ENCOUNTER — Encounter: Payer: Self-pay | Admitting: Internal Medicine

## 2016-10-23 VITALS — BP 100/64 | HR 69 | Temp 98.1°F | Resp 16 | Wt 142.0 lb

## 2016-10-23 DIAGNOSIS — F988 Other specified behavioral and emotional disorders with onset usually occurring in childhood and adolescence: Secondary | ICD-10-CM

## 2016-10-23 DIAGNOSIS — F411 Generalized anxiety disorder: Secondary | ICD-10-CM | POA: Diagnosis not present

## 2016-10-23 MED ORDER — FLUOXETINE HCL 40 MG PO CAPS: 40.0000 mg | ORAL_CAPSULE | Freq: Every day | ORAL | 3 refills | Status: DC

## 2016-10-23 MED ORDER — AMPHETAMINE-DEXTROAMPHETAMINE 30 MG PO TABS: 30.0000 mg | ORAL_TABLET | Freq: Every day | ORAL | 0 refills | Status: DC

## 2016-10-23 NOTE — Progress Notes (Signed)
Subjective:    Patient ID: Mariah Rodriguez, female    DOB: 1960-10-02, 56 y.o.   MRN: [id number]  HPI The patient is here for follow up.  ADD:  She is taking her medication as prescribed.  She last filled it 10/04/16.  She feels the medication is effective.  She denies side effects, including palpitations, headaches, lightheadedness, decreased appetite and weight loss.    Anxiety: She is taking her medication daily as prescribed. She denies any side effects from the medication. She feels her anxiety is well controlled and she is happy with her current dose of medication.   She denies changes in her health and has no concerns.   Medications and allergies reviewed with patient and updated if appropriate.  Patient Active Problem List   Diagnosis Date Noted  . Generalized anxiety disorder 01/21/2015  . GERD (gastroesophageal reflux disease)   . Postmenopausal symptoms   . DRY MOUTH 10/11/2009  . DEPRESSION 12/02/2008  . 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 TREMOR 12/02/2008  . UNSPECIFIED VITAMIN D DEFICIENCY 10/26/2008  . POSTCONCUSSION SYNDROME 10/21/2008  . Attention deficit disorder 10/21/2008    Current Outpatient Prescriptions on File Prior to Visit  Medication Sig Dispense Refill  . cetirizine (ZYRTEC) 10 MG tablet Take 1 tablet (10 mg total) by mouth daily. 30 tablet 0  . estradiol-norethindrone (COMBIPATCH) 0.05-0.14 MG/DAY Place 1 patch onto the skin 2 (two) times a week.    . finasteride (PROSCAR) 5 MG tablet Take 0.5 tablets (2.5 mg total) by mouth daily.    . fluticasone (FLONASE) 50 MCG/ACT nasal spray Place 2 sprays into both nostrils daily. 16 g 0  . guaiFENesin (MUCINEX) 600 MG 12 hr tablet Take 1 tablet (600 mg total) by mouth 2 (two) times daily as needed for cough or to loosen phlegm. 14 tablet 0   No current facility-administered medications on file prior to visit.     Past Medical History:  Diagnosis Date  . ADD   . Cervicalgia   . DEPRESSION    . GERD (gastroesophageal reflux disease)   . Injury of left index finger 10/2013   fracture PIP  . PERIMENOPAUSAL SYNDROME   . Postconcussion syndrome 05/2008   frontal lobe contusion with CHI  . Unspecified vitamin D deficiency     Past Surgical History:  Procedure Laterality Date  . BREAST SURGERY  2000   Breast biopsy  . CHOLECYSTECTOMY  1985    Social History   Social History  . Marital status: Single    Spouse name: N/A  . Number of children: N/A  . Years of education: N/A   Social History Main Topics  . Smoking status: Never Smoker  . Smokeless tobacco: Never Used  . Alcohol use Yes     Comment: Social  . Drug use: No  . Sexual activity: Not on file   Other Topics Concern  . 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 Hypertension Sister   . Hypertension Mother   . Hypertension Maternal Grandmother   . Osteoarthritis Mother   . Polymyalgia rheumatica Mother   . Liver cancer Father 40    Review of Systems  Constitutional: Negative for appetite change, chills, fever and unexpected weight change.  Respiratory: Negative for cough, shortness of breath and wheezing.   Cardiovascular: Negative for chest pain, palpitations and  leg swelling.  Neurological: Negative for light-headedness and headaches.       Objective:   Vitals:   10/23/16 0853  BP: 100/64  Pulse: 69  Resp: 16  Temp: 98.1 F (36.7 C)   Wt Readings from Last 3 Encounters:  10/23/16 142 lb (64.4 kg)  04/04/16 140 lb (63.5 kg)  03/13/16 139 lb (63 kg)   Body mass index is 24.37 kg/m.   Physical Exam  Constitutional: She appears well-developed and well-nourished. No distress.  Skin: She is not diaphoretic.  Psychiatric: She has a normal mood and affect. Her behavior is normal. Judgment and thought content normal.        Assessment & Plan:    See Problem List for  Assessment and Plan of chronic medical problems.

## 2016-10-23 NOTE — Assessment & Plan Note (Signed)
Controlled, stable Continue current dose of medication Refilled for one year

## 2016-10-23 NOTE — Patient Instructions (Addendum)
  Medications reviewed and updated.  No changes recommended at this time.  Your prescription(s) have been submitted to your pharmacy. Please take as directed and contact our office if you believe you are having problem(s) with the medication(s).    Please followup in 6 months   

## 2016-10-23 NOTE — Assessment & Plan Note (Signed)
Controlled, stable Continue current dose of medication [place] controlled substance database checked.  Ok to fill medication. Refill given to patient today

## 2016-11-02 ENCOUNTER — Telehealth: Payer: Self-pay | Admitting: Internal Medicine

## 2016-11-02 NOTE — Telephone Encounter (Signed)
Pt needs PA on amphetamine-dextroamphetamine (ADDERALL) 30 MG tablet

## 2016-11-05 NOTE — Telephone Encounter (Signed)
PA for Adderall has been approved, Pharmacy and pt notified.

## 2016-12-27 MED FILL — AMOXICILLIN 500 MG CAPSULE: 500 | 7 days supply | Qty: 21 | Fill #0

## 2016-12-27 MED FILL — OXYCODONE-ACETAMINOPHEN 5-3: 5-325 | 2 days supply | Qty: 8 | Fill #0

## 2016-12-27 MED FILL — CHLORHEXIDINE 0.12% RINSE: 0.12 | 15 days supply | Qty: 473 | Fill #0

## 2016-12-31 ENCOUNTER — Telehealth: Payer: Self-pay | Admitting: Internal Medicine

## 2016-12-31 NOTE — Telephone Encounter (Signed)
Check [place] registry last filled 10/04/2016.Marland Kitchen.[name]/lmb

## 2016-12-31 NOTE — Telephone Encounter (Signed)
Pt called requesting a refill on amphetamine-dextroamphetamine (ADDERALL) 30 MG tablet.

## 2016-12-31 NOTE — Telephone Encounter (Signed)
Ok to fill 

## 2017-01-01 MED ORDER — AMPHETAMINE-DEXTROAMPHETAMINE 30 MG PO TABS: 30.0000 mg | ORAL_TABLET | Freq: Every day | ORAL | 0 refills | Status: DC

## 2017-01-01 NOTE — Telephone Encounter (Signed)
Notified pt rx ready for pick-up.../lmb 

## 2017-01-02 MED FILL — AMPHETAMINE SALTS 30 MG TAB: 30 | 30 days supply | Qty: 30 | Fill #0

## 2017-01-07 ENCOUNTER — Ambulatory Visit (INDEPENDENT_AMBULATORY_CARE_PROVIDER_SITE_OTHER): Payer: 59

## 2017-01-07 DIAGNOSIS — Z23 Encounter for immunization: Secondary | ICD-10-CM | POA: Diagnosis not present

## 2017-01-09 ENCOUNTER — Ambulatory Visit: Payer: 59

## 2017-03-18 ENCOUNTER — Other Ambulatory Visit: Payer: Self-pay | Admitting: Internal Medicine

## 2017-03-18 MED ORDER — AMPHETAMINE-DEXTROAMPHETAMINE 30 MG PO TABS: 30.0000 mg | ORAL_TABLET | Freq: Every day | ORAL | 0 refills | Status: DC

## 2017-03-19 ENCOUNTER — Other Ambulatory Visit: Payer: Self-pay | Admitting: Emergency Medicine

## 2017-03-19 MED ORDER — AMPHETAMINE-DEXTROAMPHETAMINE 30 MG PO TABS: 30.0000 mg | ORAL_TABLET | Freq: Every day | ORAL | 0 refills | Status: DC

## 2017-05-29 ENCOUNTER — Other Ambulatory Visit: Payer: Self-pay | Admitting: Internal Medicine

## 2017-05-29 MED ORDER — FINASTERIDE 5 MG PO TABS: 2.5000 mg | ORAL_TABLET | Freq: Every day | ORAL | 1 refills | Status: DC

## 2017-05-29 MED ORDER — AMPHETAMINE-DEXTROAMPHETAMINE 30 MG PO TABS: 30.0000 mg | ORAL_TABLET | Freq: Every day | ORAL | 0 refills | Status: DC

## 2017-05-29 NOTE — Telephone Encounter (Signed)
Check [place] registry last filled 03/19/2017../lmb 

## 2017-08-29 ENCOUNTER — Other Ambulatory Visit: Payer: Self-pay | Admitting: Internal Medicine

## 2017-08-30 ENCOUNTER — Encounter: Payer: Self-pay | Admitting: Nurse Practitioner

## 2017-09-01 NOTE — Patient Instructions (Addendum)
  Medications reviewed and updated.  No changes recommended at this time.  Your prescription(s) have been submitted to your pharmacy. Please take as directed and contact our office if you believe you are having problem(s) with the medication(s).    Please followup in 6 months   

## 2017-09-01 NOTE — Progress Notes (Signed)
Subjective:    Patient ID: Mariah Rodriguez, female    DOB: Sep 10, 1960, 57 y.o.   MRN: [id number]  HPI The patient is here for follow up.  ADD:  She is taking her medication as prescribed.  She feels the medication is effective.  She denies side effects, including palpitations, headaches, lightheadedness, decreased appetite and weight loss.   Depression:  She is taking her medication daily as prescribed. She denies any side effects from the medication. She feels her depression is well controlled and she is happy with her current dose of medication.   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  She has chronic pain.  Has seen [facility] years ago. She also Dr Farris Has a while ago.  Would like to see Dr Danielle Dess again and is afraid she may need surgery, but wants to avoid it.  She has radiating pain in arms and legs.  No n/t.  No weakness.  She is doing yoga and that helps.   Medications and allergies reviewed with patient and updated if appropriate.  Patient Active Problem List   Diagnosis Date Noted  . Generalized anxiety disorder 01/21/2015  . GERD (gastroesophageal reflux disease)   . Postmenopausal symptoms   . DRY MOUTH 10/11/2009  . Depression 12/02/2008  . 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 TREMOR 12/02/2008  . UNSPECIFIED VITAMIN D DEFICIENCY 10/26/2008  . POSTCONCUSSION SYNDROME 10/21/2008  . Attention deficit disorder 10/21/2008    Current Outpatient Medications on File Prior to Visit  Medication Sig Dispense Refill  . amphetamine-dextroamphetamine (ADDERALL) 30 MG tablet Take 1 tablet by mouth daily. 30 tablet 0  . cetirizine (ZYRTEC) 10 MG tablet Take 1 tablet (10 mg total) by mouth daily. 30 tablet 0  . estradiol-norethindrone (COMBIPATCH) 0.05-0.14 MG/DAY Place 1 patch onto the skin 2 (two) times a week.    . finasteride (PROSCAR) 5 MG tablet Take 0.5 tablets (2.5 mg total) by mouth daily. 90 tablet 1  . FLUoxetine (PROZAC) 40 MG capsule Take 1 capsule (40 mg total) by mouth daily.  90 capsule 3  . fluticasone (FLONASE) 50 MCG/ACT nasal spray Place 2 sprays into both nostrils daily. 16 g 0   No current facility-administered medications on file prior to visit.     Past Medical History:  Diagnosis Date  . ADD   . Cervicalgia   . DEPRESSION   . GERD (gastroesophageal reflux disease)   . Injury of left index finger 10/2013   fracture PIP  . PERIMENOPAUSAL SYNDROME   . Postconcussion syndrome 05/2008   frontal lobe contusion with CHI  . Unspecified vitamin D deficiency     Past Surgical History:  Procedure Laterality Date  . BREAST SURGERY  2000   Breast biopsy  . CHOLECYSTECTOMY  1985    Social History   Socioeconomic History  . Marital status: Single    Spouse name: Not on file  . Number of children: Not on file  . Years of education: Not on file  . Highest education level: Not on file  Occupational History  . Not on file  Social Needs  . Financial resource strain: Not on file  . Food insecurity:    Worry: Not on file    Inability: Not on file  . Transportation needs:    Medical: Not on file    Non-medical: Not on file  Tobacco Use  . Smoking status: Never Smoker  . Smokeless tobacco: Never Used  Substance and Sexual Activity  . Alcohol use: Yes  Comment: Social  . Drug use: No  . Sexual activity: Not on file  Lifestyle  . Physical activity:    Days per week: Not on file    Minutes per session: Not on file  . Stress: Not on file  Relationships  . Social connections:    Talks on phone: Not on file    Gets together: Not on file    Attends religious service: Not on file    Active member of club or organization: Not on file    Attends meetings of clubs or organizations: Not on file    Relationship status: Not on file  Other Topics Concern  . 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 Hypertension Mother   .  Osteoarthritis Mother   . Polymyalgia rheumatica Mother   . Liver cancer Father 33  . Hypertension Sister   . Hypertension Maternal Grandmother     Review of Systems  Constitutional: Negative for appetite change and unexpected weight change.  Respiratory: Negative for shortness of breath.   Cardiovascular: Negative for chest pain and leg swelling.  Neurological: Positive for headaches. Negative for light-headedness.       Objective:   Vitals:   09/03/17 1004  BP: 114/80  Pulse: 76  Resp: 16  Temp: 98 F (36.7 C)  SpO2: 99%   BP Readings from Last 3 Encounters:  09/03/17 114/80  10/23/16 100/64  04/04/16 114/74   Wt Readings from Last 3 Encounters:  09/03/17 140 lb (63.5 kg)  10/23/16 142 lb (64.4 kg)  04/04/16 140 lb (63.5 kg)   Body mass index is 24.03 kg/m.   Physical Exam    Constitutional: Appears well-developed and well-nourished. No distress.  HENT:  Head: Normocephalic and atraumatic.  Cardiovascular: Normal rate, regular rhythm and normal heart sounds.   No murmur heard.   No edema Pulmonary/Chest: Effort normal and breath sounds normal. No respiratory distress. No has no wheezes. No rales.  Skin: Skin is warm and dry. Not diaphoretic.  Psychiatric: Normal mood and affect. Behavior is normal.      Assessment & Plan:    See Problem List for Assessment and Plan of chronic medical problems.

## 2017-09-03 ENCOUNTER — Encounter: Payer: Self-pay | Admitting: Internal Medicine

## 2017-09-03 ENCOUNTER — Ambulatory Visit: Payer: 59 | Admitting: Internal Medicine

## 2017-09-03 VITALS — BP 114/80 | HR 76 | Temp 98.0°F | Resp 16 | Wt 140.0 lb

## 2017-09-03 DIAGNOSIS — F3289 Other specified depressive episodes: Secondary | ICD-10-CM | POA: Diagnosis not present

## 2017-09-03 DIAGNOSIS — M5030 Other cervical disc degeneration, unspecified cervical region: Secondary | ICD-10-CM

## 2017-09-03 DIAGNOSIS — M51369 Other intervertebral disc degeneration, lumbar region without mention of lumbar back pain or lower extremity pain: Secondary | ICD-10-CM | POA: Insufficient documentation

## 2017-09-03 DIAGNOSIS — F988 Other specified behavioral and emotional disorders with onset usually occurring in childhood and adolescence: Secondary | ICD-10-CM

## 2017-09-03 DIAGNOSIS — M5136 Other intervertebral disc degeneration, lumbar region: Secondary | ICD-10-CM | POA: Diagnosis not present

## 2017-09-03 MED ORDER — AMPHETAMINE-DEXTROAMPHETAMINE 30 MG PO TABS: 30.0000 mg | ORAL_TABLET | Freq: Every day | ORAL | 0 refills | Status: DC

## 2017-09-03 MED ORDER — TRETINOIN 0.1 % EX CREA: TOPICAL_CREAM | Freq: Every day | CUTANEOUS | 0 refills | Status: DC

## 2017-09-03 NOTE — Assessment & Plan Note (Signed)
Controlled, stable Continue current dose of medication  

## 2017-09-03 NOTE — Assessment & Plan Note (Signed)
Pain and radiation of pain into legs Will go back to Dr Farris Has  Ideally wants MRI Continue yoga which has helped.

## 2017-09-03 NOTE — Assessment & Plan Note (Signed)
Pain and radiation of pain  Will go back to Dr Farris Has  Ideally wants MRI Continue yoga which has helped.

## 2017-09-03 NOTE — Assessment & Plan Note (Signed)
Controlled, stable Denies side effects Continue current dose of medication Refilled today

## 2017-09-10 ENCOUNTER — Telehealth: Payer: Self-pay | Admitting: Internal Medicine

## 2017-09-10 MED ORDER — AMPHETAMINE-DEXTROAMPHETAMINE 20 MG PO TABS: 30.0000 mg | ORAL_TABLET | Freq: Every day | ORAL | 0 refills | Status: DC

## 2017-09-10 NOTE — Telephone Encounter (Signed)
Spoke with pt to inform.  

## 2017-09-10 NOTE — Telephone Encounter (Signed)
Copied from CRM [id number]. Topic: Quick Communication - See Telephone Encounter >> Sep 10, 2017  3:11 PM Eston Mould B wrote: CRM for notification. See Telephone encounter for: 09/10/17.  Pt states the pharmacy is out of the 30 mg adderall she is asking if she can get a rx sent for   taking 1 1/2 a day   [id number] [place], [place] - [address] [phone] (Phone) [phone] (Fax)

## 2017-09-10 NOTE — Telephone Encounter (Signed)
Please advise if youre okay with this. [place] Controlled Substance Database checked. Last filled on 05/29/17

## 2017-09-10 NOTE — Telephone Encounter (Signed)
Ok sent!

## 2017-09-28 ENCOUNTER — Encounter: Payer: Self-pay | Admitting: Family Medicine

## 2017-09-28 ENCOUNTER — Ambulatory Visit: Payer: 59 | Admitting: Family Medicine

## 2017-09-28 VITALS — BP 118/68 | HR 75 | Temp 98.3°F | Wt 135.0 lb

## 2017-09-28 DIAGNOSIS — R21 Rash and other nonspecific skin eruption: Secondary | ICD-10-CM | POA: Diagnosis not present

## 2017-09-28 MED ORDER — PREDNISONE 20 MG PO TABS: 40.0000 mg | ORAL_TABLET | Freq: Every day | ORAL | 0 refills | Status: AC

## 2017-09-28 NOTE — Progress Notes (Signed)
Chief Complaint  Patient presents with  . Rash    Mariah Rodriguez is a 57 y.o. female here for a skin complaint.  Duration: 2 months Location: entire body Pruritic? Yes Painful? No Drainage? No New soaps/lotions/topicals/detergents? No Sick contacts? Yes Other associated symptoms: new lesions do arise Therapies tried thus far: 2.5% hydrocortisone, Benadryl, Zyrtec  ROS:  Const: No fevers Skin: As noted in HPI  Past Medical History:  Diagnosis Date  . ADD   . Cervicalgia   . DEPRESSION   . GERD (gastroesophageal reflux disease)   . Injury of left index finger 10/2013   fracture PIP  . PERIMENOPAUSAL SYNDROME   . Postconcussion syndrome 05/2008   frontal lobe contusion with CHI  . Unspecified vitamin D deficiency     BP 118/68   Pulse 75   Temp 98.3 F (36.8 C) (Oral)   Wt 135 lb (61.2 kg)   SpO2 99%   BMI 23.17 kg/m  Gen: awake, alert, appearing stated age Lungs: No accessory muscle use Skin: over extremities and upper torso, there are small excoriated papules with evidence of scratching. No drainage, TTP, fluctuance; no tracking of lesions, no web space or palmar/sole involvement Psych: Age appropriate judgment and insight  Rash - Plan: predniSONE (DELTASONE) 20 MG tablet  Pred burst for 5 d, cont Zyrtec and prn Benadryl.  Put clothing and sheets through a dryer cycle. Bed bugs/bite is on differential, does not appear like scabies. F/u prn. The patient voiced understanding and agreement to the plan.  Jilda Rocheicholas Paul [place], DO 09/28/17 1:04 PM

## 2017-09-28 NOTE — Patient Instructions (Addendum)
Continue Zyrtec and Benadryl as needed.  Consider putting your clothing and sheets through a dryer cycle.  Let us know if you need anything.

## 2017-10-26 ENCOUNTER — Other Ambulatory Visit: Payer: Self-pay | Admitting: Nurse Practitioner

## 2017-10-26 ENCOUNTER — Other Ambulatory Visit: Payer: Self-pay | Admitting: Internal Medicine

## 2017-10-26 DIAGNOSIS — F411 Generalized anxiety disorder: Secondary | ICD-10-CM

## 2017-10-26 DIAGNOSIS — J069 Acute upper respiratory infection, unspecified: Secondary | ICD-10-CM

## 2017-10-28 MED ORDER — CETIRIZINE HCL 10 MG PO TABS: 10.0000 mg | ORAL_TABLET | Freq: Every day | ORAL | 0 refills | Status: DC

## 2017-10-28 MED ORDER — AMPHETAMINE-DEXTROAMPHETAMINE 20 MG PO TABS: 30.0000 mg | ORAL_TABLET | Freq: Every day | ORAL | 0 refills | Status: DC

## 2017-10-28 MED ORDER — FLUTICASONE PROPIONATE 50 MCG/ACT NA SUSP: 2.0000 | Freq: Every day | NASAL | 0 refills | Status: DC

## 2017-10-28 NOTE — Telephone Encounter (Signed)
[place] Controlled Substance Database checked. Last filled on 09/10/17

## 2017-11-20 ENCOUNTER — Other Ambulatory Visit: Payer: Self-pay | Admitting: Internal Medicine

## 2017-11-20 DIAGNOSIS — J069 Acute upper respiratory infection, unspecified: Secondary | ICD-10-CM

## 2017-12-04 ENCOUNTER — Telehealth: Payer: Self-pay | Admitting: Emergency Medicine

## 2017-12-04 NOTE — Telephone Encounter (Signed)
PA completed for Tretinoin Cream. Key [id number]

## 2017-12-04 NOTE — Telephone Encounter (Signed)
PA approved pharmacy notified 

## 2017-12-10 ENCOUNTER — Other Ambulatory Visit: Payer: Self-pay | Admitting: Internal Medicine

## 2017-12-10 MED ORDER — AMPHETAMINE-DEXTROAMPHETAMINE 20 MG PO TABS: 30.0000 mg | ORAL_TABLET | Freq: Every day | ORAL | 0 refills | Status: DC

## 2017-12-10 NOTE — Telephone Encounter (Signed)
Check [place] registry last filled 10/28/2017.Marland Kitchen.[name]/lmb

## 2017-12-12 ENCOUNTER — Telehealth: Payer: Self-pay

## 2017-12-12 NOTE — Telephone Encounter (Signed)
PA for adderall initiated.   RUE:[id number]:[id number]  Waiting on response.

## 2018-01-07 ENCOUNTER — Ambulatory Visit: Payer: 59 | Admitting: Sports Medicine

## 2018-01-09 ENCOUNTER — Ambulatory Visit (INDEPENDENT_AMBULATORY_CARE_PROVIDER_SITE_OTHER): Payer: 59

## 2018-01-09 ENCOUNTER — Encounter: Payer: Self-pay | Admitting: Podiatry

## 2018-01-09 ENCOUNTER — Ambulatory Visit: Payer: 59 | Admitting: Podiatry

## 2018-01-09 DIAGNOSIS — M205X1 Other deformities of toe(s) (acquired), right foot: Secondary | ICD-10-CM

## 2018-01-09 DIAGNOSIS — M2011 Hallux valgus (acquired), right foot: Secondary | ICD-10-CM

## 2018-01-09 DIAGNOSIS — M779 Enthesopathy, unspecified: Secondary | ICD-10-CM

## 2018-01-09 DIAGNOSIS — M722 Plantar fascial fibromatosis: Secondary | ICD-10-CM

## 2018-01-09 NOTE — Progress Notes (Signed)
Subjective:    Patient ID: Mariah Rodriguez, female    DOB: [birth year], 57 y.o.   MRN: [id number]  HPI 57 year old female presents the office today for concerns of bunions and pain to the right big toe joint which is been ongoing for about 1 year.  She states that she gets an achy sensation mostly at nighttime after being on her feet.  She says it feels stiff at times.  Hurts with certain shoes.  She states the pain is gotten larger over time.  She said no significant treatment.  No recent injury.  She has no other concerns.   Review of Systems  All other systems reviewed and are negative.  Past Medical History:  Diagnosis Date  . ADD   . Cervicalgia   . DEPRESSION   . GERD (gastroesophageal reflux disease)   . Injury of left index finger 10/2013   fracture PIP  . PERIMENOPAUSAL SYNDROME   . Postconcussion syndrome 05/2008   frontal lobe contusion with CHI  . Unspecified vitamin D deficiency     Past Surgical History:  Procedure Laterality Date  . BREAST SURGERY  2000   Breast biopsy  . CHOLECYSTECTOMY  1985     Current Outpatient Medications:  .  amphetamine-dextroamphetamine (ADDERALL) 20 MG tablet, Take 1.5 tablets (30 mg total) by mouth daily., Disp: 45 tablet, Rfl: 0 .  amphetamine-dextroamphetamine (ADDERALL) 30 MG tablet, Take 1 tablet by mouth daily., Disp: 30 tablet, Rfl: 0 .  cetirizine (ZYRTEC) 10 MG tablet, Take 1 tablet (10 mg total) by mouth daily., Disp: 30 tablet, Rfl: 0 .  estradiol-norethindrone (COMBIPATCH) 0.05-0.14 MG/DAY, Place 1 patch onto the skin 2 (two) times a week., Disp: , Rfl:  .  finasteride (PROSCAR) 5 MG tablet, Take 0.5 tablets (2.5 mg total) by mouth daily., Disp: 90 tablet, Rfl: 1 .  FLUoxetine (PROZAC) 40 MG capsule, TAKE 1 CAPSULE BY MOUTH EVERY DAY, Disp: 90 capsule, Rfl: 3 .  fluticasone (FLONASE) 50 MCG/ACT nasal spray, SPRAY 2 SPRAYS INTO EACH NOSTRIL EVERY DAY, Disp: 16 g, Rfl: 3 .  tretinoin (RETIN-A) 0.1 % cream, Apply topically at  bedtime., Disp: 45 g, Rfl: 0  No Known Allergies      Objective:   Physical Exam  General: AAO x3, NAD  Dermatological: Skin is warm, dry and supple bilateral. Nails x 10 are well manicured; remaining integument appears unremarkable at this time. There are no open sores, no preulcerative lesions, no rash or signs of infection present.  Vascular: Dorsalis Pedis artery and Posterior Tibial artery pedal pulses are 2/4 bilateral with immedate capillary fill time. Pedal hair growth present. No varicosities and no lower extremity edema present bilateral. There is no pain with calf compression, swelling, warmth, erythema.   Neruologic: Grossly intact via light touch bilateral.Protective threshold with Semmes Wienstein monofilament intact to all pedal sites bilateral.   Musculoskeletal: There is mild discomfort in end range of motion of the first MPJ and there is mild decreased range of motion of the first MPJ but there is no crepitation with range of motion.  No first ray hypermobility is present.  Mild bunion prominence is present.  Occasionally she does get some pain in the arch of her foot currently not expensing any pain.  Muscular strength 5/5 in all groups tested bilateral.  Gait: Unassisted, Nonantalgic.      Assessment & Plan:  57 year old female capsulitis, hallux limitus right first MPJ, plantar fasciitis -Treatment options discussed including all alternatives, risks, and complications -Etiology  of symptoms were discussed -X-rays were obtained and reviewed with the patient.  Mildly is present.  Also there is an elongated first metatarsal as well as elevated first ray resulting in hallux limitus.  We discussed various treatment options.  Discussed shoe modifications and what is different soled shoe but also when check insurance coverage to see if we get her some custom inserts to offload the first MPJ.  I will have her follow-up with Mariah Rodriguez if we get the inserts.  Mariah Rodriguez  DPM

## 2018-01-16 ENCOUNTER — Telehealth: Payer: Self-pay | Admitting: Podiatry

## 2018-01-16 NOTE — Telephone Encounter (Signed)
Left message with pt to call to discuss orthotic coverage.

## 2018-01-17 ENCOUNTER — Other Ambulatory Visit: Payer: Self-pay | Admitting: Internal Medicine

## 2018-01-17 MED ORDER — AMPHETAMINE-DEXTROAMPHETAMINE 20 MG PO TABS: 30.0000 mg | ORAL_TABLET | Freq: Every day | ORAL | 0 refills | Status: DC

## 2018-01-17 NOTE — Telephone Encounter (Signed)
Check [place] registry last filled 12/12/2017. Refill protocol 24-48 hours will hold until md return on Monday.Marland KitchenRaechel Rodriguez

## 2018-03-04 ENCOUNTER — Other Ambulatory Visit: Payer: Self-pay | Admitting: Internal Medicine

## 2018-03-04 NOTE — Telephone Encounter (Signed)
Last refill was 01/17/18 Last OV was 09/03/17 Next OV is not made

## 2018-03-06 MED ORDER — AMPHETAMINE-DEXTROAMPHETAMINE 20 MG PO TABS: 30.0000 mg | ORAL_TABLET | Freq: Every day | ORAL | 0 refills | Status: DC

## 2018-05-14 ENCOUNTER — Other Ambulatory Visit: Payer: Self-pay | Admitting: Internal Medicine

## 2018-05-14 ENCOUNTER — Encounter: Payer: Self-pay | Admitting: Internal Medicine

## 2018-05-21 NOTE — Progress Notes (Deleted)
Subjective:    Patient ID: Mariah Rodriguez, female    DOB: 1960/11/13, 58 y.o.   MRN: [id number]  HPI The patient is here for follow up.  ADD:  She is taking her medication as prescribed.  She feels the medication is effective.  She denies side effects, including palpitations, headaches, lightheadedness, decreased appetite and weight loss.   Depression: She is taking her medication daily as prescribed. She denies any side effects from the medication. She feels her depression is well controlled and she is happy with her current dose of medication.     Medications and allergies reviewed with patient and updated if appropriate.  Patient Active Problem List   Diagnosis Date Noted  . DDD (degenerative disc disease), cervical 09/03/2017  . DDD (degenerative disc disease), lumbar 09/03/2017  . GERD (gastroesophageal reflux disease)   . DRY MOUTH 10/11/2009  . Depression 12/02/2008  . 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 TREMOR 12/02/2008  . UNSPECIFIED VITAMIN D DEFICIENCY 10/26/2008  . Attention deficit disorder 10/21/2008    Current Outpatient Medications on File Prior to Visit  Medication Sig Dispense Refill  . amphetamine-dextroamphetamine (ADDERALL) 20 MG tablet Take 1.5 tablets (30 mg total) by mouth daily. 45 tablet 0  . amphetamine-dextroamphetamine (ADDERALL) 30 MG tablet Take 1 tablet by mouth daily. 30 tablet 0  . cetirizine (ZYRTEC) 10 MG tablet Take 1 tablet (10 mg total) by mouth daily. 30 tablet 0  . estradiol-norethindrone (COMBIPATCH) 0.05-0.14 MG/DAY Place 1 patch onto the skin 2 (two) times a week.    . finasteride (PROSCAR) 5 MG tablet Take 0.5 tablets (2.5 mg total) by mouth daily. 90 tablet 1  . FLUoxetine (PROZAC) 40 MG capsule TAKE 1 CAPSULE BY MOUTH EVERY DAY 90 capsule 3  . fluticasone (FLONASE) 50 MCG/ACT nasal spray SPRAY 2 SPRAYS INTO EACH NOSTRIL EVERY DAY 16 g 3  . tretinoin (RETIN-A) 0.1 % cream Apply topically at bedtime. 45 g 0   No  current facility-administered medications on file prior to visit.     Past Medical History:  Diagnosis Date  . ADD   . Cervicalgia   . DEPRESSION   . GERD (gastroesophageal reflux disease)   . Injury of left index finger 10/2013   fracture PIP  . PERIMENOPAUSAL SYNDROME   . Postconcussion syndrome 05/2008   frontal lobe contusion with CHI  . Unspecified vitamin D deficiency     Past Surgical History:  Procedure Laterality Date  . BREAST SURGERY  2000   Breast biopsy  . CHOLECYSTECTOMY  1985    Social History   Socioeconomic History  . Marital status: Single    Spouse name: Not on file  . Number of children: Not on file  . Years of education: Not on file  . Highest education level: Not on file  Occupational History  . Not on file  Social Needs  . Financial resource strain: Not on file  . Food insecurity:    Worry: Not on file    Inability: Not on file  . Transportation needs:    Medical: Not on file    Non-medical: Not on file  Tobacco Use  . Smoking status: Never Smoker  . Smokeless tobacco: Never Used  Substance and Sexual Activity  . Alcohol use: Yes    Comment: Social  . Drug use: No  . Sexual activity: Not on file  Lifestyle  . Physical activity:    Days per week: Not on file    Minutes per  session: Not on file  . Stress: Not on file  Relationships  . Social connections:    Talks on phone: Not on file    Gets together: Not on file    Attends religious service: Not on file    Active member of club or organization: Not on file    Attends meetings of clubs or organizations: Not on file    Relationship status: Not on file  Other Topics Concern  . 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 Hypertension Mother   . Osteoarthritis Mother   . Polymyalgia rheumatica Mother   . Liver cancer Father 78  . Hypertension Sister   . Hypertension  Maternal Grandmother     Review of Systems     Objective:  There were no vitals filed for this visit. BP Readings from Last 3 Encounters:  09/28/17 118/68  09/03/17 114/80  10/23/16 100/64   Wt Readings from Last 3 Encounters:  09/28/17 135 lb (61.2 kg)  09/03/17 140 lb (63.5 kg)  10/23/16 142 lb (64.4 kg)   There is no height or weight on file to calculate BMI.   Physical Exam    Constitutional: Appears well-developed and well-nourished. No distress.  HENT:  Head: Normocephalic and atraumatic.  Neck: Neck supple. No tracheal deviation present. No thyromegaly present.  No cervical lymphadenopathy Cardiovascular: Normal rate, regular rhythm and normal heart sounds.   No murmur heard. No carotid bruit .  No edema Pulmonary/Chest: Effort normal and breath sounds normal. No respiratory distress. No has no wheezes. No rales.  Skin: Skin is warm and dry. Not diaphoretic.  Psychiatric: Normal mood and affect. Behavior is normal.      Assessment & Plan:    See Problem List for Assessment and Plan of chronic medical problems.

## 2018-05-22 ENCOUNTER — Ambulatory Visit: Payer: 59 | Admitting: Internal Medicine

## 2018-05-22 DIAGNOSIS — Z0289 Encounter for other administrative examinations: Secondary | ICD-10-CM

## 2018-06-02 NOTE — Progress Notes (Signed)
Subjective:    Patient ID: Mariah Rodriguez, female    DOB: [birth year], 58 y.o.   MRN: [id number]  HPI The patient is here for follow up.  ADD:  She is taking her medication as prescribed - she does not generally take it on the weekends and sometimes forgets to take it.  She feels the medication is effective.  She denies side effects, including palpitations, headaches, lightheadedness, decreased appetite and weight loss.   Anxiety, Depression: She is taking her medication daily as prescribed. She denies any side effects from the medication. She feels her depression and anxiety are well controlled and she is happy with her current dose of medication.   Hair loss:  She is taking finasteride daily.  She thinks it has helped decrease hair loss and would like to continue it.    Medications and allergies reviewed with patient and updated if appropriate.  Patient Active Problem List   Diagnosis Date Noted  . DDD (degenerative disc disease), cervical 09/03/2017  . DDD (degenerative disc disease), lumbar 09/03/2017  . GERD (gastroesophageal reflux disease)   . DRY MOUTH 10/11/2009  . Depression 12/02/2008  . 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 TREMOR 12/02/2008  . UNSPECIFIED VITAMIN D DEFICIENCY 10/26/2008  . Attention deficit disorder 10/21/2008    Current Outpatient Medications on File Prior to Visit  Medication Sig Dispense Refill  . cetirizine (ZYRTEC) 10 MG tablet Take 1 tablet (10 mg total) by mouth daily. 30 tablet 0  . estradiol-norethindrone (COMBIPATCH) 0.05-0.14 MG/DAY Place 1 patch onto the skin 2 (two) times a week.    . fluticasone (FLONASE) 50 MCG/ACT nasal spray SPRAY 2 SPRAYS INTO EACH NOSTRIL EVERY DAY 16 g 3  . tretinoin (RETIN-A) 0.1 % cream Apply topically at bedtime. 45 g 0   No current facility-administered medications on file prior to visit.     Past Medical History:  Diagnosis Date  . ADD   . Cervicalgia   . DEPRESSION   . GERD  (gastroesophageal reflux disease)   . Injury of left index finger 10/2013   fracture PIP  . PERIMENOPAUSAL SYNDROME   . Postconcussion syndrome 05/2008   frontal lobe contusion with CHI  . Unspecified vitamin D deficiency     Past Surgical History:  Procedure Laterality Date  . BREAST SURGERY  2000   Breast biopsy  . CHOLECYSTECTOMY  1985    Social History   Socioeconomic History  . Marital status: Single    Spouse name: Not on file  . Number of children: Not on file  . Years of education: Not on file  . Highest education level: Not on file  Occupational History  . Not on file  Social Needs  . Financial resource strain: Not on file  . Food insecurity:    Worry: Not on file    Inability: Not on file  . Transportation needs:    Medical: Not on file    Non-medical: Not on file  Tobacco Use  . Smoking status: Never Smoker  . Smokeless tobacco: Never Used  Substance and Sexual Activity  . Alcohol use: Yes    Comment: Social  . Drug use: No  . Sexual activity: Not on file  Lifestyle  . Physical activity:    Days per week: Not on file    Minutes per session: Not on file  . Stress: Not on file  Relationships  . Social connections:    Talks on phone: Not on file  Gets together: Not on file    Attends religious service: Not on file    Active member of club or organization: Not on file    Attends meetings of clubs or organizations: Not on file    Relationship status: Not on file  Other Topics Concern  . Not on file  Social History Narrative   Lives with wife Lupita LeashDonna and their 2 children. 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 Hypertension Mother   . Osteoarthritis Mother   . Polymyalgia rheumatica Mother   . Liver cancer Father [phone]  . Hypertension Sister   . Hypertension Maternal Grandmother     Review of Systems  Constitutional: Negative for chills and fever.  Respiratory: Negative for cough, shortness of breath and  wheezing.   Cardiovascular: Negative for chest pain, palpitations and leg swelling.  Neurological: Negative for light-headedness and headaches.       Objective:   Vitals:   06/03/18 1350  BP: 122/72  Pulse: 86  Resp: 16  Temp: 98.5 F (36.9 C)  SpO2: 98%   BP Readings from Last 3 Encounters:  06/03/18 122/72  09/28/17 118/68  09/03/17 114/80   Wt Readings from Last 3 Encounters:  06/03/18 141 lb 12.8 oz (64.3 kg)  09/28/17 135 lb (61.2 kg)  09/03/17 140 lb (63.5 kg)   Body mass index is 24.34 kg/m.   Physical Exam    Constitutional: Appears well-developed and well-nourished. No distress.  HENT:  Head: Normocephalic and atraumatic.  Cardiovascular: Normal rate, regular rhythm and normal heart sounds.   No murmur heard.  No edema Pulmonary/Chest: Effort normal and breath sounds normal. No respiratory distress. No has no wheezes. No rales.  Skin: Skin is warm and dry. Not diaphoretic.  Psychiatric: Normal mood and affect. Behavior is normal.      Assessment & Plan:    See Problem List for Assessment and Plan of chronic medical problems.    Fu in one year

## 2018-06-03 ENCOUNTER — Ambulatory Visit: Payer: 59 | Admitting: Internal Medicine

## 2018-06-03 ENCOUNTER — Encounter: Payer: Self-pay | Admitting: Internal Medicine

## 2018-06-03 VITALS — BP 122/72 | HR 86 | Temp 98.5°F | Resp 16 | Ht 64.0 in | Wt 141.8 lb

## 2018-06-03 DIAGNOSIS — F411 Generalized anxiety disorder: Secondary | ICD-10-CM | POA: Diagnosis not present

## 2018-06-03 DIAGNOSIS — F3289 Other specified depressive episodes: Secondary | ICD-10-CM | POA: Diagnosis not present

## 2018-06-03 DIAGNOSIS — F988 Other specified behavioral and emotional disorders with onset usually occurring in childhood and adolescence: Secondary | ICD-10-CM | POA: Diagnosis not present

## 2018-06-03 DIAGNOSIS — L659 Nonscarring hair loss, unspecified: Secondary | ICD-10-CM | POA: Insufficient documentation

## 2018-06-03 MED ORDER — AMPHETAMINE-DEXTROAMPHETAMINE 20 MG PO TABS: 30.0000 mg | ORAL_TABLET | Freq: Every day | ORAL | 0 refills | Status: DC

## 2018-06-03 MED ORDER — FINASTERIDE 5 MG PO TABS: 2.5000 mg | ORAL_TABLET | Freq: Every day | ORAL | 3 refills | Status: DC

## 2018-06-03 MED ORDER — FLUOXETINE HCL 40 MG PO CAPS: ORAL_CAPSULE | ORAL | 3 refills | Status: DC

## 2018-06-03 NOTE — Assessment & Plan Note (Signed)
Controlled, stable Continue current dose of medication - prozac 40 mg daily  

## 2018-06-03 NOTE — Assessment & Plan Note (Signed)
Controlled, stable Continue current dose of medication Fu annually

## 2018-06-03 NOTE — Assessment & Plan Note (Signed)
Has seen derm in past  Taking finasteride daily - continue  -refilled today

## 2018-06-03 NOTE — Assessment & Plan Note (Signed)
Controlled, stable Continue current dose of medication - Adderall 30 mg daily

## 2018-06-03 NOTE — Patient Instructions (Addendum)
Medications reviewed and updated.  Changes include :   none  Your prescription(s) have been submitted to your pharmacy. Please take as directed and contact our office if you believe you are having problem(s) with the medication(s).   Please followup in one year   

## 2018-08-09 ENCOUNTER — Other Ambulatory Visit: Payer: Self-pay | Admitting: Internal Medicine

## 2018-08-11 MED ORDER — AMPHETAMINE-DEXTROAMPHETAMINE 20 MG PO TABS: 30.0000 mg | ORAL_TABLET | Freq: Every day | ORAL | 0 refills | Status: DC

## 2018-08-11 NOTE — Telephone Encounter (Signed)
Check [place] registry last filled 06/03/2018.Marland KitchenRaechel Chute

## 2018-08-19 DIAGNOSIS — M180 Bilateral primary osteoarthritis of first carpometacarpal joints: Secondary | ICD-10-CM | POA: Insufficient documentation

## 2018-08-19 DIAGNOSIS — M654 Radial styloid tenosynovitis [de Quervain]: Secondary | ICD-10-CM | POA: Insufficient documentation

## 2018-09-10 ENCOUNTER — Other Ambulatory Visit: Payer: Self-pay | Admitting: Internal Medicine

## 2018-09-10 MED ORDER — AMPHETAMINE-DEXTROAMPHETAMINE 20 MG PO TABS: 30.0000 mg | ORAL_TABLET | Freq: Every day | ORAL | 0 refills | Status: DC

## 2018-09-10 NOTE — Telephone Encounter (Signed)
Last OV 06/03/18 Next OV not scheduled  [place] Controlled Substance Database checked. Last filled on  08/11/18

## 2018-09-22 ENCOUNTER — Other Ambulatory Visit: Payer: Self-pay

## 2018-09-22 DIAGNOSIS — F411 Generalized anxiety disorder: Secondary | ICD-10-CM

## 2018-09-22 DIAGNOSIS — J069 Acute upper respiratory infection, unspecified: Secondary | ICD-10-CM

## 2018-09-22 MED ORDER — FLUTICASONE PROPIONATE 50 MCG/ACT NA SUSP: NASAL | 2 refills | Status: DC

## 2018-09-22 MED ORDER — FLUOXETINE HCL 40 MG PO CAPS: ORAL_CAPSULE | ORAL | 2 refills | Status: DC

## 2018-09-22 MED ORDER — FINASTERIDE 5 MG PO TABS: 2.5000 mg | ORAL_TABLET | Freq: Every day | ORAL | 2 refills | Status: DC

## 2018-10-11 ENCOUNTER — Encounter (HOSPITAL_COMMUNITY): Payer: Self-pay | Admitting: Emergency Medicine

## 2018-10-11 ENCOUNTER — Emergency Department (HOSPITAL_COMMUNITY)
Admission: EM | Admit: 2018-10-11 | Discharge: 2018-10-11 | Disposition: A | Discharge status: Home / Self Care | Payer: 59 | Attending: Emergency Medicine | Admitting: Emergency Medicine

## 2018-10-11 ENCOUNTER — Other Ambulatory Visit: Payer: Self-pay

## 2018-10-11 DIAGNOSIS — Y92012 Bathroom of single-family (private) house as the place of occurrence of the external cause: Secondary | ICD-10-CM | POA: Insufficient documentation

## 2018-10-11 DIAGNOSIS — Z79899 Other long term (current) drug therapy: Secondary | ICD-10-CM | POA: Insufficient documentation

## 2018-10-11 DIAGNOSIS — S61411A Laceration without foreign body of right hand, initial encounter: Secondary | ICD-10-CM | POA: Insufficient documentation

## 2018-10-11 DIAGNOSIS — W268XXA Contact with other sharp object(s), not elsewhere classified, initial encounter: Secondary | ICD-10-CM | POA: Diagnosis not present

## 2018-10-11 DIAGNOSIS — S6991XA Unspecified injury of right wrist, hand and finger(s), initial encounter: Secondary | ICD-10-CM | POA: Diagnosis present

## 2018-10-11 DIAGNOSIS — Y9389 Activity, other specified: Secondary | ICD-10-CM | POA: Insufficient documentation

## 2018-10-11 DIAGNOSIS — Y998 Other external cause status: Secondary | ICD-10-CM | POA: Insufficient documentation

## 2018-10-11 MED ORDER — LIDOCAINE HCL (PF) 1 % IJ SOLN
5.0000 mL | Freq: Once | INTRAMUSCULAR | Status: AC
  Administered 2018-10-11: 5 mL
  Filled 2018-10-11: qty 30

## 2018-10-11 NOTE — ED Provider Notes (Signed)
[facility] DEPT Provider Note   CSN: [id number] Arrival date & time: 10/11/18  2007     History   Chief Complaint Chief Complaint  Patient presents with  . Laceration    HPI Mariah Rodriguez is a 58 y.o. female presenting for evaluation of right hand laceration.  Patient states approximately 1 hour prior to arrival she was removing tile from the bathroom wall when she scraped the back of her hand against the wall, and sustained a cut on the dorsal aspect of her right hand.  She reports acute onset pain and bleeding.  She denies numbness or tingling.  Pain is mild at this time.  She denies injury elsewhere.  She has no medical problems, takes no medications daily.  She is not on blood thinners.  Tetanus is up-to-date.     HPI  Past Medical History:  Diagnosis Date  . ADD   . Cervicalgia   . DEPRESSION   . GERD (gastroesophageal reflux disease)   . Injury of left index finger 10/2013   fracture PIP  . PERIMENOPAUSAL SYNDROME   . Postconcussion syndrome 05/2008   frontal lobe contusion with CHI  . Unspecified vitamin D deficiency     Patient Active Problem List   Diagnosis Date Noted  . Hair loss 06/03/2018  . DDD (degenerative disc disease), cervical 09/03/2017  . DDD (degenerative disc disease), lumbar 09/03/2017  . Generalized anxiety disorder 01/21/2015  . Depression 12/02/2008  . 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 TREMOR 12/02/2008  . UNSPECIFIED VITAMIN D DEFICIENCY 10/26/2008  . Attention deficit disorder 10/21/2008    Past Surgical History:  Procedure Laterality Date  . BREAST SURGERY  2000   Breast biopsy  . CHOLECYSTECTOMY  1985     OB History   No obstetric history on file.      Home Medications    Prior to Admission medications   Medication Sig Start Date End Date Taking? Authorizing Provider  amphetamine-dextroamphetamine (ADDERALL) 20 MG tablet Take 1.5 tablets (30 mg total) by mouth daily. 09/10/18    Binnie Rail, MD  cetirizine (ZYRTEC) 10 MG tablet Take 1 tablet (10 mg total) by mouth daily. 10/28/17   Binnie Rail, MD  estradiol-norethindrone [facility]) 0.05-0.14 MG/DAY Place 1 patch onto the skin 2 (two) times a week.    [provider]  finasteride (PROSCAR) 5 MG tablet Take 0.5 tablets (2.5 mg total) by mouth daily. 09/22/18   Binnie Rail, MD  FLUoxetine (PROZAC) 40 MG capsule TAKE 1 CAPSULE BY MOUTH EVERY DAY 09/22/18   Burns, Claudina Lick, MD  fluticasone [facility]) 50 MCG/ACT nasal spray SPRAY 2 SPRAYS INTO EACH NOSTRIL EVERY DAY 09/22/18   Binnie Rail, MD  tretinoin (RETIN-A) 0.1 % cream Apply topically at bedtime. 09/03/17   Binnie Rail, MD    Family History Family History  Problem Relation Age of Onset  . Hypertension Mother   . Osteoarthritis Mother   . Polymyalgia rheumatica Mother   . Liver cancer Father 23  . Hypertension Sister   . Hypertension Maternal Grandmother     Social History Social History   Tobacco Use  . Smoking status: Never Smoker  . Smokeless tobacco: Never Used  Substance Use Topics  . Alcohol use: Yes    Comment: Social  . Drug use: No     Allergies   Patient has no known allergies.   Review of Systems Review of Systems  Skin: Positive for wound.  Neurological:  Negative for numbness.     Physical Exam Updated Vital Signs BP 104/62 (BP Location: Left Arm)   Pulse 63   Temp 97.8 F (36.6 C) (Oral)   Resp 15   SpO2 98%   Physical Exam Vitals signs and nursing note reviewed.  Constitutional:      General: She is not in acute distress.    Appearance: She is well-developed.  HENT:     Head: Normocephalic and atraumatic.  Neck:     Musculoskeletal: Normal range of motion.  Pulmonary:     Effort: Pulmonary effort is normal.  Abdominal:     General: There is no distension.  Musculoskeletal: Normal range of motion.     Comments: full Active range of motion of the fingers without difficulty.  Distal sensation  intact.  Good distal cap refill.  Strength against resistance intact.  Skin:    General: Skin is warm.     Capillary Refill: Capillary refill takes less than 2 seconds.     Findings: No rash.     Comments: 1 cm lac of the dorsal R hand just proximal tot the 3rd MCP. Lac ~1 mm deep  Neurological:     Mental Status: She is alert and oriented to person, place, and time.      ED Treatments / Results  Labs (all labs ordered are listed, but only abnormal results are displayed) Labs Reviewed - No data to display  EKG    Radiology No results found.  Procedures .Marland Kitchen.Laceration Repair  Date/Time: 10/11/2018 9:43 PM Performed by: Alveria Apleyaccavale, Kiyan Burmester, PA-C Authorized by: Alveria Apleyaccavale, Terresa Marlett, PA-C   Consent:    Consent obtained:  Verbal   Consent given by:  Patient   Risks discussed:  Infection, need for additional repair, pain, retained foreign body, poor cosmetic result, poor wound healing and nerve damage Anesthesia (see MAR for exact dosages):    Anesthesia method:  Local infiltration   Local anesthetic:  Lidocaine 1% w/o epi Laceration details:    Location:  Hand   Hand location:  R hand, dorsum   Length (cm):  1   Depth (mm):  1 Repair type:    Repair type:  Simple Pre-procedure details:    Preparation:  Patient was prepped and draped in usual sterile fashion Exploration:    Hemostasis achieved with:  Direct pressure   Wound exploration: wound explored through full range of motion and entire depth of wound probed and visualized     Wound extent: no foreign bodies/material noted, no muscle damage noted, no nerve damage noted, no tendon damage noted and no vascular damage noted   Treatment:    Area cleansed with:  Betadine   Amount of cleaning:  Standard Skin repair:    Repair method:  Sutures   Suture size:  4-0   Wound skin closure material used: vicryl rapide.   Suture technique:  Simple interrupted   Number of sutures:  2 Approximation:    Approximation:  Close  Post-procedure details:    Dressing:  Sterile dressing   Patient tolerance of procedure:  Tolerated well, no immediate complications   (including critical care time)  Medications Ordered in ED Medications  lidocaine (PF) (XYLOCAINE) 1 % injection 5 mL (5 mLs Infiltration Given 10/11/18 2123)     Initial Impression / Assessment and Plan / ED Course  I have reviewed the triage vital signs and the nursing notes.  Pertinent labs & imaging results that were available during my care of the patient were  reviewed by me and considered in my medical decision making (see chart for details).        Patient presenting for evaluation of hand laceration.  Physical exam reassuring, she is neurovascularly intact.  No active bleeding upon my evaluation.  Laceration is just proximal to the MCP, however when patient makes a fist was over the MCP.  As this is the joint, will place sutures. Lac is superficial and visually does not extend into the joint. I do not believe xray would be beneficial.  Repair performed as described above.  Aftercare instructions given.  This time, patient appears safe for discharge.  Return precautions given.  Patient states she understands and agrees to plan   Final Clinical Impressions(s) / ED Diagnoses   Final diagnoses:  Laceration of right hand without foreign body, initial encounter    ED Discharge Orders    None       Drema BalzarineCaccavale, Troye Hiemstra, PA-C 10/11/18 2146    Benjiman CorePickering, Nathan, MD 10/11/18 2256

## 2018-10-11 NOTE — ED Triage Notes (Signed)
Pt reports chipping tile and cut posterior right hand. 1.5cm laceration noted to hand with mild bleeding.

## 2018-10-11 NOTE — Discharge Instructions (Addendum)
1. Medications: Tylenol or ibuprofen for pain 2. Treatment: ice for swelling, keep wound clean with warm soap and water and keep bandage dry, do not submerge in water for 24 hours 3. Follow Up: Return to the emergency department for increased redness, drainage of pus from the wound. Your stitches are dissolvable, so they do not need to be removed   WOUND CARE  Keep area clean and dry for 24 hours. Do not remove bandage, if applied.  After 24 hours, remove bandage and wash wound gently with mild soap and warm water. Reapply a new bandage after cleaning wound, if directed.   Continue daily cleansing with soap and water until stitches are removed.  Do not apply any ointments or creams to the wound while stitches are in place, as this may cause delayed healing. Return if you experience any of the following signs of infection: Swelling, redness, pus drainage, streaking, fever >101.0 F  Return if you experience excessive bleeding that does not stop after 15-20 minutes of constant, firm pressure.

## 2018-11-02 ENCOUNTER — Other Ambulatory Visit: Payer: Self-pay | Admitting: Internal Medicine

## 2018-11-02 DIAGNOSIS — J069 Acute upper respiratory infection, unspecified: Secondary | ICD-10-CM

## 2018-12-17 ENCOUNTER — Other Ambulatory Visit: Payer: Self-pay | Admitting: Internal Medicine

## 2018-12-18 ENCOUNTER — Other Ambulatory Visit: Payer: Self-pay | Admitting: Internal Medicine

## 2018-12-18 MED ORDER — AMPHETAMINE-DEXTROAMPHETAMINE 20 MG PO TABS: 30.0000 mg | ORAL_TABLET | Freq: Every day | ORAL | 0 refills | Status: DC

## 2018-12-18 NOTE — Telephone Encounter (Signed)
Check [place] registry last filled 09/10/2018../lmb  

## 2019-01-19 ENCOUNTER — Other Ambulatory Visit: Payer: Self-pay | Admitting: *Deleted

## 2019-01-19 DIAGNOSIS — Z20822 Contact with and (suspected) exposure to covid-19: Secondary | ICD-10-CM

## 2019-01-20 LAB — NOVEL CORONAVIRUS, NAA: SARS-CoV-2, NAA: NOT DETECTED

## 2019-01-29 ENCOUNTER — Other Ambulatory Visit: Payer: Self-pay | Admitting: Internal Medicine

## 2019-01-29 MED ORDER — AMPHETAMINE-DEXTROAMPHETAMINE 20 MG PO TABS: 30.0000 mg | ORAL_TABLET | Freq: Every day | ORAL | 0 refills | Status: DC

## 2019-01-29 NOTE — Telephone Encounter (Signed)
[place] Controlled Database Checked Last filled: 12/18/18 #45 LOV w/you: 06/03/18 Next appt w/you: None

## 2019-02-20 ENCOUNTER — Other Ambulatory Visit: Payer: Self-pay

## 2019-02-20 DIAGNOSIS — Z20822 Contact with and (suspected) exposure to covid-19: Secondary | ICD-10-CM

## 2019-02-22 LAB — NOVEL CORONAVIRUS, NAA: SARS-CoV-2, NAA: NOT DETECTED

## 2019-03-17 DIAGNOSIS — Z20828 Contact with and (suspected) exposure to other viral communicable diseases: Secondary | ICD-10-CM | POA: Diagnosis not present

## 2019-04-23 ENCOUNTER — Other Ambulatory Visit: Payer: Self-pay | Admitting: Internal Medicine

## 2019-04-27 ENCOUNTER — Other Ambulatory Visit: Payer: Self-pay | Admitting: Internal Medicine

## 2019-04-29 ENCOUNTER — Other Ambulatory Visit: Payer: Self-pay | Admitting: Internal Medicine

## 2019-04-29 MED ORDER — AMPHETAMINE-DEXTROAMPHETAMINE 20 MG PO TABS: 30.0000 mg | ORAL_TABLET | Freq: Every day | ORAL | 0 refills | Status: DC

## 2019-04-29 NOTE — Telephone Encounter (Signed)
Check [place] registry last filled 01/29/2019.Marland KitchenRaechel Chute

## 2019-05-24 NOTE — Telephone Encounter (Signed)
It wont let me refuse it.  

## 2019-05-25 MED ORDER — AMPHETAMINE-DEXTROAMPHETAMINE 20 MG PO TABS: 30.0000 mg | ORAL_TABLET | Freq: Every day | ORAL | 0 refills | Status: DC

## 2019-06-18 ENCOUNTER — Other Ambulatory Visit: Payer: Self-pay

## 2019-06-18 ENCOUNTER — Other Ambulatory Visit: Payer: Self-pay | Admitting: Internal Medicine

## 2019-06-18 DIAGNOSIS — F411 Generalized anxiety disorder: Secondary | ICD-10-CM

## 2019-06-18 MED ORDER — FLUOXETINE HCL 40 MG PO CAPS: ORAL_CAPSULE | ORAL | 2 refills | Status: DC

## 2019-07-11 DIAGNOSIS — M79644 Pain in right finger(s): Secondary | ICD-10-CM | POA: Diagnosis not present

## 2019-08-06 ENCOUNTER — Other Ambulatory Visit: Payer: Self-pay | Admitting: Internal Medicine

## 2019-08-07 NOTE — Telephone Encounter (Signed)
Check [place] registry last filled 06/01/2019. Will forward to MD for approval once she return on Monday.Marland KitchenRaechel Chute

## 2019-08-09 MED ORDER — AMPHETAMINE-DEXTROAMPHETAMINE 20 MG PO TABS: 30.0000 mg | ORAL_TABLET | Freq: Every day | ORAL | 0 refills | Status: DC

## 2019-09-23 ENCOUNTER — Other Ambulatory Visit: Payer: Self-pay | Admitting: Internal Medicine

## 2019-09-24 MED ORDER — AMPHETAMINE-DEXTROAMPHETAMINE 20 MG PO TABS: 30.0000 mg | ORAL_TABLET | Freq: Every day | ORAL | 0 refills | Status: DC

## 2019-09-24 NOTE — Telephone Encounter (Signed)
She has not been seen in over a year. Last OV 06/03/18. Will not let me deny.

## 2019-10-26 ENCOUNTER — Other Ambulatory Visit: Payer: Self-pay | Admitting: Internal Medicine

## 2019-10-27 MED ORDER — AMPHETAMINE-DEXTROAMPHETAMINE 20 MG PO TABS: 30.0000 mg | ORAL_TABLET | Freq: Every day | ORAL | 0 refills | Status: DC

## 2019-10-27 NOTE — Telephone Encounter (Signed)
Medication sent to pof. Please call her she needs to be see at least once a year - needs f/u

## 2019-10-29 NOTE — Telephone Encounter (Signed)
Left message for patient and also sent her a my-chart message to call office to set up appointment.

## 2019-11-26 NOTE — Patient Instructions (Addendum)
   Medications reviewed and updated.  Changes include :   none  Your prescription(s) have been submitted to your pharmacy. Please take as directed and contact our office if you believe you are having problem(s) with the medication(s).    Please followup in 1 year   

## 2019-11-26 NOTE — Progress Notes (Signed)
Subjective:    Patient ID: Mariah Rodriguez, female    DOB: August 19, 1960, 59 y.o.   MRN: [id number]  HPI The patient is here for follow up of their chronic medical problems, including ADD, depression, hair loss  ADD:  She is taking her medication as prescribed.  She feels the medication is effective.  She denies side effects, including palpitations, headaches, lightheadedness, decreased appetite and weight loss.    Depression: She is taking her medication daily as prescribed. She denies any side effects from the medication. She feels her depression is well controlled and she is happy with her current dose of medication.    She has no other concerns or questions  Medications and allergies reviewed with patient and updated if appropriate.  Patient Active Problem List   Diagnosis Date Noted  . Hair loss 06/03/2018  . DDD (degenerative disc disease), cervical 09/03/2017  . DDD (degenerative disc disease), lumbar 09/03/2017  . Generalized anxiety disorder 01/21/2015  . Depression 12/02/2008  . 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 TREMOR 12/02/2008  . UNSPECIFIED VITAMIN D DEFICIENCY 10/26/2008  . Attention deficit disorder 10/21/2008    Current Outpatient Medications on File Prior to Visit  Medication Sig Dispense Refill  . cetirizine (ZYRTEC) 10 MG tablet Take 1 tablet (10 mg total) by mouth daily. 30 tablet 0  . fluticasone (FLONASE) 50 MCG/ACT nasal spray SPRAY 2 SPRAYS INTO EACH NOSTRIL EVERY DAY 16 g 2  . estradiol-norethindrone (COMBIPATCH) 0.05-0.14 MG/DAY Place 1 patch onto the skin 2 (two) times a week.     No current facility-administered medications on file prior to visit.    Past Medical History:  Diagnosis Date  . ADD   . Cervicalgia   . DEPRESSION   . GERD (gastroesophageal reflux disease)   . Injury of left index finger 10/2013   fracture PIP  . PERIMENOPAUSAL SYNDROME   . Postconcussion syndrome 05/2008   frontal lobe contusion with CHI  .  Unspecified vitamin D deficiency     Past Surgical History:  Procedure Laterality Date  . BREAST SURGERY  2000   Breast biopsy  . CHOLECYSTECTOMY  1985    Social History   Socioeconomic History  . Marital status: Single    Spouse name: Not on file  . Number of children: Not on file  . Years of education: Not on file  . Highest education level: Not on file  Occupational History  . Not on file  Tobacco Use  . Smoking status: Never Smoker  . Smokeless tobacco: Never Used  Substance and Sexual Activity  . Alcohol use: Yes    Comment: Social  . Drug use: No  . Sexual activity: Not on file  Other Topics Concern  . Not on file  Social History Narrative   Lives with wife Lupita Leash and their 2 children. 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 Difficulty of Paying Living Expenses:   Food Insecurity:   . Worried About Programme researcher, broadcasting/film/video in the Last Year:   . Barista in the Last Year:   Transportation Needs:   . Freight forwarder (Medical):   Marland Kitchen Lack of Transportation (Non-Medical):   Physical Activity:   . Days of Exercise per Week:   . Minutes of Exercise per Session:   Stress:   . Feeling of Stress :   Social Connections:   . Frequency of Communication with Friends and Family:   .  Frequency of Social Gatherings with Friends and Family:   . Attends Religious Services:   . Active Member of Clubs or Organizations:   . Attends Banker Meetings:   Marland Kitchen Marital Status:     Family History  Problem Relation Age of Onset  . Hypertension Mother   . Osteoarthritis Mother   . Polymyalgia rheumatica Mother   . Liver cancer Father 100  . Hypertension Sister   . Hypertension Maternal Grandmother     Review of Systems  Constitutional: Negative for fever and unexpected weight change.  Respiratory: Negative for shortness of breath.   Cardiovascular: Negative for chest pain and palpitations.  Neurological:  Negative for headaches.       Objective:   Vitals:   11/27/19 1351  BP: 120/74  Pulse: 88  Temp: 98.2 F (36.8 C)  SpO2: 98%   BP Readings from Last 3 Encounters:  11/27/19 120/74  10/11/18 104/62  06/03/18 122/72   Wt Readings from Last 3 Encounters:  11/27/19 135 lb (61.2 kg)  06/03/18 141 lb 12.8 oz (64.3 kg)  09/28/17 135 lb (61.2 kg)   Body mass index is 23.17 kg/m.   Physical Exam Constitutional:      General: She is not in acute distress.    Appearance: Normal appearance. She is not ill-appearing.  HENT:     Head: Normocephalic and atraumatic.  Skin:    General: Skin is warm and dry.  Neurological:     General: No focal deficit present.     Mental Status: She is alert and oriented to person, place, and time.  Psychiatric:        Mood and Affect: Mood normal.        Behavior: Behavior normal.        Thought Content: Thought content normal.        Judgment: Judgment normal.            Assessment & Plan:    See Problem List for Assessment and Plan of chronic medical problems.    This visit occurred during the SARS-CoV-2 public health emergency.  Safety protocols were in place, including screening questions prior to the visit, additional usage of staff PPE, and extensive cleaning of exam room while observing appropriate contact time as indicated for disinfecting solutions.    Follow up in 1 year

## 2019-11-27 ENCOUNTER — Ambulatory Visit: Payer: BC Managed Care – PPO | Admitting: Internal Medicine

## 2019-11-27 ENCOUNTER — Encounter: Payer: Self-pay | Admitting: Internal Medicine

## 2019-11-27 ENCOUNTER — Other Ambulatory Visit: Payer: Self-pay

## 2019-11-27 VITALS — BP 120/74 | HR 88 | Temp 98.2°F | Ht 64.0 in | Wt 135.0 lb

## 2019-11-27 DIAGNOSIS — L659 Nonscarring hair loss, unspecified: Secondary | ICD-10-CM

## 2019-11-27 DIAGNOSIS — F411 Generalized anxiety disorder: Secondary | ICD-10-CM

## 2019-11-27 DIAGNOSIS — F3289 Other specified depressive episodes: Secondary | ICD-10-CM

## 2019-11-27 DIAGNOSIS — F988 Other specified behavioral and emotional disorders with onset usually occurring in childhood and adolescence: Secondary | ICD-10-CM

## 2019-11-27 MED ORDER — TRETINOIN 0.1 % EX CREA: TOPICAL_CREAM | Freq: Every day | CUTANEOUS | 3 refills | Status: DC

## 2019-11-27 MED ORDER — FINASTERIDE 5 MG PO TABS: 2.5000 mg | ORAL_TABLET | Freq: Every day | ORAL | 3 refills | Status: DC

## 2019-11-27 MED ORDER — FLUOXETINE HCL 40 MG PO CAPS: ORAL_CAPSULE | ORAL | 3 refills | Status: DC

## 2019-11-27 MED ORDER — AMPHETAMINE-DEXTROAMPHETAMINE 20 MG PO TABS: 30.0000 mg | ORAL_TABLET | Freq: Every day | ORAL | 0 refills | Status: DC

## 2019-11-27 NOTE — Assessment & Plan Note (Signed)
Chronic Has seen dermatology in the past Taking finasteride daily, which is working well Continue Refill today

## 2019-11-27 NOTE — Assessment & Plan Note (Signed)
Chronic Controlled, stable Continue current dose of medication Adderall 30 mg daily Okay to follow-up in 1 year

## 2019-11-27 NOTE — Assessment & Plan Note (Signed)
Chronic Controlled, stable Continue current dose of medication Continue fluoxetine 40 mg daily

## 2019-11-27 NOTE — Assessment & Plan Note (Signed)
Chronic Controlled, stable Continue current dose of medication Continue fluoxetine 40 mg daily 

## 2019-12-03 DIAGNOSIS — Z1152 Encounter for screening for COVID-19: Secondary | ICD-10-CM | POA: Diagnosis not present

## 2019-12-05 ENCOUNTER — Other Ambulatory Visit: Payer: Self-pay | Admitting: Internal Medicine

## 2019-12-05 MED ORDER — TRETINOIN 0.1 % EX CREA: TOPICAL_CREAM | Freq: Every day | CUTANEOUS | 3 refills | Status: DC

## 2020-01-13 ENCOUNTER — Other Ambulatory Visit: Payer: Self-pay | Admitting: Internal Medicine

## 2020-01-13 MED ORDER — AMPHETAMINE-DEXTROAMPHETAMINE 20 MG PO TABS: 30.0000 mg | ORAL_TABLET | Freq: Every day | ORAL | 0 refills | Status: DC

## 2020-02-21 ENCOUNTER — Other Ambulatory Visit: Payer: Self-pay | Admitting: Internal Medicine

## 2020-02-22 MED ORDER — AMPHETAMINE-DEXTROAMPHETAMINE 20 MG PO TABS: 30.0000 mg | ORAL_TABLET | Freq: Every day | ORAL | 0 refills | Status: DC

## 2020-04-06 ENCOUNTER — Other Ambulatory Visit: Payer: Self-pay | Admitting: Internal Medicine

## 2020-04-06 MED ORDER — AMPHETAMINE-DEXTROAMPHETAMINE 20 MG PO TABS: 30.0000 mg | ORAL_TABLET | Freq: Every day | ORAL | 0 refills | Status: DC

## 2020-05-04 DIAGNOSIS — Z1152 Encounter for screening for COVID-19: Secondary | ICD-10-CM | POA: Diagnosis not present

## 2020-05-24 ENCOUNTER — Other Ambulatory Visit: Payer: Self-pay | Admitting: Internal Medicine

## 2020-05-24 MED ORDER — AMPHETAMINE-DEXTROAMPHETAMINE 20 MG PO TABS: 30.0000 mg | ORAL_TABLET | Freq: Every day | ORAL | 0 refills | Status: DC

## 2020-07-04 ENCOUNTER — Other Ambulatory Visit: Payer: Self-pay | Admitting: Internal Medicine

## 2020-07-04 NOTE — Telephone Encounter (Signed)
Check [place] registry last filled 05/24/2020,,/lmb

## 2020-07-05 MED ORDER — AMPHETAMINE-DEXTROAMPHETAMINE 20 MG PO TABS: 30.0000 mg | ORAL_TABLET | Freq: Every day | ORAL | 0 refills | Status: DC

## 2020-07-06 DIAGNOSIS — M180 Bilateral primary osteoarthritis of first carpometacarpal joints: Secondary | ICD-10-CM | POA: Diagnosis not present

## 2020-08-10 ENCOUNTER — Other Ambulatory Visit: Payer: Self-pay | Admitting: Internal Medicine

## 2020-08-11 MED ORDER — AMPHETAMINE-DEXTROAMPHETAMINE 20 MG PO TABS: 30.0000 mg | ORAL_TABLET | Freq: Every day | ORAL | 0 refills | Status: DC

## 2020-08-24 DIAGNOSIS — M19012 Primary osteoarthritis, left shoulder: Secondary | ICD-10-CM | POA: Diagnosis not present

## 2020-08-24 DIAGNOSIS — M7542 Impingement syndrome of left shoulder: Secondary | ICD-10-CM | POA: Diagnosis not present

## 2020-08-24 DIAGNOSIS — M25512 Pain in left shoulder: Secondary | ICD-10-CM | POA: Insufficient documentation

## 2020-09-13 ENCOUNTER — Other Ambulatory Visit: Payer: Self-pay | Admitting: Internal Medicine

## 2020-09-13 MED ORDER — AMPHETAMINE-DEXTROAMPHETAMINE 20 MG PO TABS: 30.0000 mg | ORAL_TABLET | Freq: Every day | ORAL | 0 refills | Status: DC

## 2020-10-17 DIAGNOSIS — L658 Other specified nonscarring hair loss: Secondary | ICD-10-CM | POA: Diagnosis not present

## 2020-10-18 ENCOUNTER — Other Ambulatory Visit: Payer: Self-pay | Admitting: Internal Medicine

## 2020-10-18 MED ORDER — AMPHETAMINE-DEXTROAMPHETAMINE 20 MG PO TABS: 30.0000 mg | ORAL_TABLET | Freq: Every day | ORAL | 0 refills | Status: DC

## 2020-11-23 ENCOUNTER — Other Ambulatory Visit: Payer: Self-pay | Admitting: Internal Medicine

## 2020-11-23 NOTE — Telephone Encounter (Signed)
Last RF per controlled substance database: 10/18/20 Last OV: 11/27/19 Next OV: none scheduled; mychart message has been sent to pt to request appt.  Didn't know if you were willing to do a short supply since it has not been a year yet.

## 2020-11-24 MED ORDER — AMPHETAMINE-DEXTROAMPHETAMINE 20 MG PO TABS: 30.0000 mg | ORAL_TABLET | Freq: Every day | ORAL | 0 refills | Status: DC

## 2020-12-06 DIAGNOSIS — M25551 Pain in right hip: Secondary | ICD-10-CM | POA: Diagnosis not present

## 2020-12-20 NOTE — Progress Notes (Signed)
Subjective:    Patient ID: Mariah Rodriguez, female    DOB: May 29, 1960, 60 y.o.   MRN: [id number]  HPI The patient is here for follow up of their chronic medical problems, including ADD, anxiety, depression and hair loss.  She is taking her medication as prescribed.  She denies any side effects.    Medications and allergies reviewed with patient and updated if appropriate.  Patient Active Problem List   Diagnosis Date Noted   Hair loss 06/03/2018   DDD (degenerative disc disease), cervical 09/03/2017   DDD (degenerative disc disease), lumbar 09/03/2017   Generalized anxiety disorder 01/21/2015   Depression 12/02/2008   NEUROPATHY, IDIOPATHIC PERIPHERAL 12/02/2008   TREMOR 12/02/2008   UNSPECIFIED VITAMIN D DEFICIENCY 10/26/2008   Attention deficit disorder 10/21/2008    Current Outpatient Medications on File Prior to Visit  Medication Sig Dispense Refill   amphetamine-dextroamphetamine (ADDERALL) 20 MG tablet Take 1.5 tablets (30 mg total) by mouth daily. 45 tablet 0   cetirizine (ZYRTEC) 10 MG tablet Take 1 tablet (10 mg total) by mouth daily. 30 tablet 0   estradiol-norethindrone (COMBIPATCH) 0.05-0.14 MG/DAY Place 1 patch onto the skin 2 (two) times a week.     finasteride (PROSCAR) 5 MG tablet Take 0.5 tablets (2.5 mg total) by mouth daily. 45 tablet 3   FLUoxetine (PROZAC) 40 MG capsule TAKE 1 CAPSULE BY MOUTH EVERY DAY 90 capsule 3   fluticasone (FLONASE) 50 MCG/ACT nasal spray SPRAY 2 SPRAYS INTO EACH NOSTRIL EVERY DAY 16 g 2   tretinoin (RETIN-A) 0.1 % cream Apply topically at bedtime. 45 g 3   No current facility-administered medications on file prior to visit.    Past Medical History:  Diagnosis Date   ADD    Cervicalgia    DEPRESSION    GERD (gastroesophageal reflux disease)    Injury of left index finger 10/2013   fracture PIP   PERIMENOPAUSAL SYNDROME    Postconcussion syndrome 05/2008   frontal lobe contusion with CHI   Unspecified vitamin D deficiency      Past Surgical History:  Procedure Laterality Date   BREAST SURGERY  2000   Breast biopsy   CHOLECYSTECTOMY  1985    Social History   Socioeconomic History   Marital status: Single    Spouse name: Not on file   Number of children: Not on file   Years of education: Not on file   Highest education level: Not on file  Occupational History   Not on file  Tobacco Use   Smoking status: Never   Smokeless tobacco: Never  Substance and Sexual Activity   Alcohol use: Yes    Comment: Social   Drug use: No   Sexual activity: Not on file  Other Topics Concern   Not on file  Social History Narrative   Lives with wife Lupita Leash and their 2 children. 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  Respiratory:  Negative  for shortness of breath.   Cardiovascular:  Negative for chest pain, palpitations and leg swelling.  Neurological:  Negative for light-headedness and headaches.      Objective:  There were no vitals filed for this visit. BP Readings from Last 3 Encounters:  11/27/19 120/74  10/11/18 104/62  06/03/18 122/72   Wt Readings from Last 3 Encounters:  11/27/19 135 lb (61.2 kg)  06/03/18 141 lb 12.8 oz (64.3 kg)  09/28/17 135 lb (61.2 kg)   There is no height or weight on file to calculate BMI.   Physical Exam    Constitutional: Appears well-developed and well-nourished. No distress.  HENT:  Head: Normocephalic and atraumatic.  Neck: Neck supple. No tracheal deviation present. No  thyromegaly present.  No cervical lymphadenopathy Cardiovascular: Normal rate, regular rhythm and normal heart sounds.   No murmur heard. No carotid bruit .  No edema Pulmonary/Chest: Effort normal and breath sounds normal. No respiratory distress. No has no wheezes. No rales.  Skin: Skin is warm and dry. Not diaphoretic.  Psychiatric: Normal mood and affect. Behavior is normal.      Assessment & Plan:    See Problem List for Assessment and Plan of chronic medical problems.    This visit occurred during the SARS-CoV-2 public health emergency.  Safety protocols were in place, including screening questions prior to the visit, additional usage of staff PPE, and extensive cleaning of exam room while observing appropriate contact time as indicated for disinfecting solutions.   This encounter was created in error - please disregard.

## 2020-12-20 NOTE — Patient Instructions (Signed)
  Blood work was ordered.     Medications changes include :     Your prescription(s) have been submitted to your pharmacy. Please take as directed and contact our office if you believe you are having problem(s) with the medication(s).   A referral was ordered for        Someone from their office will call you to schedule an appointment.    Please followup in 1 year  

## 2020-12-21 ENCOUNTER — Encounter: Payer: BC Managed Care – PPO | Admitting: Internal Medicine

## 2020-12-21 ENCOUNTER — Encounter: Payer: Self-pay | Admitting: Internal Medicine

## 2020-12-21 DIAGNOSIS — F988 Other specified behavioral and emotional disorders with onset usually occurring in childhood and adolescence: Secondary | ICD-10-CM

## 2020-12-21 DIAGNOSIS — F3289 Other specified depressive episodes: Secondary | ICD-10-CM

## 2020-12-21 DIAGNOSIS — L659 Nonscarring hair loss, unspecified: Secondary | ICD-10-CM

## 2020-12-21 NOTE — Assessment & Plan Note (Signed)
Chronic Controlled, stable Continue fluoxetine 40 mg daily  

## 2020-12-21 NOTE — Assessment & Plan Note (Addendum)
Chronic Has seen dermatology recently Stable Continue Proscar 2.5 mg daily

## 2020-12-21 NOTE — Assessment & Plan Note (Signed)
Chronic Controlled, stable without side effects Continue Adderall 30 mg daily

## 2020-12-22 NOTE — Patient Instructions (Addendum)
     Medications changes include :  none      Please followup in 1 year  

## 2020-12-22 NOTE — Progress Notes (Signed)
Subjective:    Patient ID: Mariah Rodriguez, female    DOB: [birth year], 60 y.o.   MRN: [id number]  HPI The patient is here for follow up of their chronic medical problems, including ADD, anxiety, depression and hair loss.  She is taking her medication as prescribed.  She denies any side effects.  She has no concerns, except pulled a hamstring and would like flexeril to take as needed - she has used this over the years for several things.    Medications and allergies reviewed with patient and updated if appropriate.  Patient Active Problem List   Diagnosis Date Noted   Pulled hamstring 12/23/2020   Hair loss 06/03/2018   DDD (degenerative disc disease), cervical 09/03/2017   DDD (degenerative disc disease), lumbar 09/03/2017   Generalized anxiety disorder 01/21/2015   Depression 12/02/2008   NEUROPATHY, IDIOPATHIC PERIPHERAL 12/02/2008   TREMOR 12/02/2008   UNSPECIFIED VITAMIN D DEFICIENCY 10/26/2008   Attention deficit disorder 10/21/2008    Current Outpatient Medications on File Prior to Visit  Medication Sig Dispense Refill   cetirizine (ZYRTEC) 10 MG tablet Take 1 tablet (10 mg total) by mouth daily. 30 tablet 0   fluticasone (FLONASE) 50 MCG/ACT nasal spray SPRAY 2 SPRAYS INTO EACH NOSTRIL EVERY DAY 16 g 2   tretinoin (RETIN-A) 0.1 % cream Apply topically at bedtime. 45 g 3   No current facility-administered medications on file prior to visit.    Past Medical History:  Diagnosis Date   ADD    Cervicalgia    DEPRESSION    GERD (gastroesophageal reflux disease)    Injury of left index finger 10/2013   fracture PIP   PERIMENOPAUSAL SYNDROME    Postconcussion syndrome 05/2008   frontal lobe contusion with CHI   Unspecified vitamin D deficiency     Past Surgical History:  Procedure Laterality Date   BREAST SURGERY  2000   Breast biopsy   CHOLECYSTECTOMY  1985    Social History   Socioeconomic History   Marital status: Single    Spouse name: Not on file    Number of children: Not on file   Years of education: Not on file   Highest education level: Not on file  Occupational History   Not on file  Tobacco Use   Smoking status: Never   Smokeless tobacco: Never  Substance and Sexual Activity   Alcohol use: Yes    Comment: Social   Drug use: No   Sexual activity: Not on file  Other Topics Concern   Not on file  Social History Narrative   Lives with wife Lupita Leash and their 2 children. 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  Respiratory:  Negative for shortness of breath.   Cardiovascular:  Negative for chest pain, palpitations and leg swelling.  Neurological:  Negative for light-headedness and headaches.      Objective:   Vitals:   12/23/20 0918  BP: 108/72  Pulse: 81  Temp: 98.3 F (36.8 C)  SpO2: 99%   BP Readings from Last 3 Encounters:  12/23/20 108/72  11/27/19 120/74  10/11/18 104/62   Wt Readings from Last 3 Encounters:  12/23/20 129 lb 6.4 oz (58.7 kg)  11/27/19 135 lb (61.2 kg)  06/03/18 141 lb 12.8 oz (64.3 kg)   Body mass index is 22.21 kg/m.   Physical Exam    Constitutional: Appears well-developed and well-nourished. No distress.  HENT:  Head: Normocephalic and atraumatic.  Neck: Neck supple. No tracheal deviation present. No thyromegaly present.  No cervical lymphadenopathy Cardiovascular: Normal rate, regular rhythm and normal heart sounds.   No murmur heard. No carotid bruit .  No edema Pulmonary/Chest: Effort normal  and breath sounds normal. No respiratory distress. No has no wheezes. No rales.  Skin: Skin is warm and dry. Not diaphoretic.  Psychiatric: Normal mood and affect. Behavior is normal.      Assessment & Plan:    Flu vaccine today  Will get shingles vaccine at some point.  See Problem List for Assessment and Plan of chronic medical problems.    This visit occurred during the SARS-CoV-2 public health emergency.  Safety protocols were in place, including screening questions prior to the visit, additional usage of staff PPE, and extensive cleaning of exam room while observing appropriate contact time as indicated for disinfecting solutions.

## 2020-12-23 ENCOUNTER — Other Ambulatory Visit: Payer: Self-pay

## 2020-12-23 ENCOUNTER — Ambulatory Visit: Payer: BC Managed Care – PPO | Admitting: Internal Medicine

## 2020-12-23 ENCOUNTER — Encounter: Payer: Self-pay | Admitting: Internal Medicine

## 2020-12-23 VITALS — BP 108/72 | HR 81 | Temp 98.3°F | Ht 64.0 in | Wt 129.4 lb

## 2020-12-23 DIAGNOSIS — Z23 Encounter for immunization: Secondary | ICD-10-CM

## 2020-12-23 DIAGNOSIS — F988 Other specified behavioral and emotional disorders with onset usually occurring in childhood and adolescence: Secondary | ICD-10-CM | POA: Diagnosis not present

## 2020-12-23 DIAGNOSIS — L659 Nonscarring hair loss, unspecified: Secondary | ICD-10-CM | POA: Diagnosis not present

## 2020-12-23 DIAGNOSIS — F3289 Other specified depressive episodes: Secondary | ICD-10-CM

## 2020-12-23 DIAGNOSIS — F411 Generalized anxiety disorder: Secondary | ICD-10-CM | POA: Diagnosis not present

## 2020-12-23 DIAGNOSIS — S76319A Strain of muscle, fascia and tendon of the posterior muscle group at thigh level, unspecified thigh, initial encounter: Secondary | ICD-10-CM | POA: Insufficient documentation

## 2020-12-23 MED ORDER — AMPHETAMINE-DEXTROAMPHETAMINE 20 MG PO TABS: 30.0000 mg | ORAL_TABLET | Freq: Every day | ORAL | 0 refills | Status: DC

## 2020-12-23 MED ORDER — FINASTERIDE 5 MG PO TABS: 2.5000 mg | ORAL_TABLET | Freq: Every day | ORAL | 3 refills | Status: DC

## 2020-12-23 MED ORDER — FLUOXETINE HCL 40 MG PO CAPS: ORAL_CAPSULE | ORAL | 3 refills | Status: DC

## 2020-12-23 MED ORDER — CYCLOBENZAPRINE HCL 10 MG PO TABS: 10.0000 mg | ORAL_TABLET | Freq: Two times a day (BID) | ORAL | 1 refills | Status: DC | PRN

## 2020-12-23 NOTE — Assessment & Plan Note (Addendum)
Chronic controlled Saw derm recently - advised to continue proscar 5 mg qd - denies side effects Continue proscar 5 mg qd - refilled today

## 2020-12-23 NOTE — Addendum Note (Signed)
Addended by: Karma Ganja on: 12/23/2020 10:11 AM   Modules accepted: Orders

## 2020-12-23 NOTE — Assessment & Plan Note (Signed)
Acute Take flexeril 10 mg bid prn

## 2020-12-23 NOTE — Assessment & Plan Note (Signed)
Chronic Controlled, stable Continue prozac 40 mg daily 

## 2020-12-23 NOTE — Assessment & Plan Note (Signed)
Chronic Controlled, stable Continue adderall 30 mg qd

## 2021-01-16 ENCOUNTER — Encounter: Payer: Self-pay | Admitting: Internal Medicine

## 2021-01-16 MED ORDER — POLYMYXIN B-TRIMETHOPRIM 10000-0.1 UNIT/ML-% OP SOLN: 1.0000 [drp] | OPHTHALMIC | 0 refills | Status: DC

## 2021-02-04 ENCOUNTER — Other Ambulatory Visit: Payer: Self-pay | Admitting: Internal Medicine

## 2021-02-05 MED ORDER — AMPHETAMINE-DEXTROAMPHETAMINE 20 MG PO TABS: 30.0000 mg | ORAL_TABLET | Freq: Every day | ORAL | 0 refills | Status: DC

## 2021-02-07 DIAGNOSIS — M5416 Radiculopathy, lumbar region: Secondary | ICD-10-CM | POA: Diagnosis not present

## 2021-02-07 DIAGNOSIS — M4316 Spondylolisthesis, lumbar region: Secondary | ICD-10-CM | POA: Diagnosis not present

## 2021-02-14 ENCOUNTER — Other Ambulatory Visit: Payer: Self-pay | Admitting: Internal Medicine

## 2021-02-14 DIAGNOSIS — M542 Cervicalgia: Secondary | ICD-10-CM | POA: Diagnosis not present

## 2021-02-14 DIAGNOSIS — M5412 Radiculopathy, cervical region: Secondary | ICD-10-CM | POA: Diagnosis not present

## 2021-02-21 DIAGNOSIS — M545 Low back pain, unspecified: Secondary | ICD-10-CM | POA: Diagnosis not present

## 2021-02-21 DIAGNOSIS — M6281 Muscle weakness (generalized): Secondary | ICD-10-CM | POA: Diagnosis not present

## 2021-02-21 DIAGNOSIS — M5416 Radiculopathy, lumbar region: Secondary | ICD-10-CM | POA: Diagnosis not present

## 2021-02-21 DIAGNOSIS — M4316 Spondylolisthesis, lumbar region: Secondary | ICD-10-CM | POA: Diagnosis not present

## 2021-03-11 ENCOUNTER — Other Ambulatory Visit: Payer: Self-pay | Admitting: Internal Medicine

## 2021-03-13 MED ORDER — AMPHETAMINE-DEXTROAMPHETAMINE 20 MG PO TABS: 30.0000 mg | ORAL_TABLET | Freq: Every day | ORAL | 0 refills | Status: DC

## 2021-04-13 ENCOUNTER — Other Ambulatory Visit: Payer: Self-pay | Admitting: Internal Medicine

## 2021-04-14 MED ORDER — AMPHETAMINE-DEXTROAMPHETAMINE 20 MG PO TABS: 30.0000 mg | ORAL_TABLET | Freq: Every day | ORAL | 0 refills | Status: DC

## 2021-05-30 ENCOUNTER — Other Ambulatory Visit: Payer: Self-pay | Admitting: Internal Medicine

## 2021-05-30 MED ORDER — AMPHETAMINE-DEXTROAMPHETAMINE 20 MG PO TABS: 30.0000 mg | ORAL_TABLET | Freq: Every day | ORAL | 0 refills | Status: DC

## 2021-06-15 DIAGNOSIS — R102 Pelvic and perineal pain: Secondary | ICD-10-CM | POA: Diagnosis not present

## 2021-06-15 DIAGNOSIS — Z6824 Body mass index (BMI) 24.0-24.9, adult: Secondary | ICD-10-CM | POA: Diagnosis not present

## 2021-06-23 ENCOUNTER — Encounter: Payer: Self-pay | Admitting: Internal Medicine

## 2021-07-01 DIAGNOSIS — Z1211 Encounter for screening for malignant neoplasm of colon: Secondary | ICD-10-CM | POA: Diagnosis not present

## 2021-07-03 ENCOUNTER — Encounter: Payer: 59 | Admitting: Obstetrics & Gynecology

## 2021-07-07 ENCOUNTER — Other Ambulatory Visit: Payer: Self-pay | Admitting: Internal Medicine

## 2021-07-08 LAB — COLOGUARD: COLOGUARD: POSITIVE — AB

## 2021-07-08 MED ORDER — AMPHETAMINE-DEXTROAMPHETAMINE 20 MG PO TABS: 30.0000 mg | ORAL_TABLET | Freq: Every day | ORAL | 0 refills | Status: DC

## 2021-07-10 ENCOUNTER — Encounter: Payer: Self-pay | Admitting: Internal Medicine

## 2021-07-10 DIAGNOSIS — R195 Other fecal abnormalities: Secondary | ICD-10-CM

## 2021-08-16 ENCOUNTER — Other Ambulatory Visit: Payer: Self-pay | Admitting: Internal Medicine

## 2021-08-16 MED ORDER — AMPHETAMINE-DEXTROAMPHETAMINE 20 MG PO TABS: 30.0000 mg | ORAL_TABLET | Freq: Every day | ORAL | 0 refills | Status: DC

## 2021-08-29 DIAGNOSIS — Z1211 Encounter for screening for malignant neoplasm of colon: Secondary | ICD-10-CM | POA: Diagnosis not present

## 2021-08-29 DIAGNOSIS — R1032 Left lower quadrant pain: Secondary | ICD-10-CM | POA: Diagnosis not present

## 2021-08-29 DIAGNOSIS — F32A Depression, unspecified: Secondary | ICD-10-CM | POA: Diagnosis not present

## 2021-08-29 DIAGNOSIS — R195 Other fecal abnormalities: Secondary | ICD-10-CM | POA: Diagnosis not present

## 2021-09-16 DIAGNOSIS — M545 Low back pain, unspecified: Secondary | ICD-10-CM | POA: Diagnosis not present

## 2021-09-16 DIAGNOSIS — M7672 Peroneal tendinitis, left leg: Secondary | ICD-10-CM | POA: Diagnosis not present

## 2021-09-22 ENCOUNTER — Other Ambulatory Visit: Payer: Self-pay | Admitting: Internal Medicine

## 2021-09-22 MED ORDER — AMPHETAMINE-DEXTROAMPHETAMINE 20 MG PO TABS: 30.0000 mg | ORAL_TABLET | Freq: Every day | ORAL | 0 refills | Status: DC

## 2021-10-04 DIAGNOSIS — Z1211 Encounter for screening for malignant neoplasm of colon: Secondary | ICD-10-CM | POA: Diagnosis not present

## 2021-10-04 DIAGNOSIS — K648 Other hemorrhoids: Secondary | ICD-10-CM | POA: Diagnosis not present

## 2021-10-04 DIAGNOSIS — D122 Benign neoplasm of ascending colon: Secondary | ICD-10-CM | POA: Diagnosis not present

## 2021-10-04 DIAGNOSIS — K5730 Diverticulosis of large intestine without perforation or abscess without bleeding: Secondary | ICD-10-CM | POA: Diagnosis not present

## 2021-10-04 DIAGNOSIS — K635 Polyp of colon: Secondary | ICD-10-CM | POA: Diagnosis not present

## 2021-10-04 DIAGNOSIS — R195 Other fecal abnormalities: Secondary | ICD-10-CM | POA: Diagnosis not present

## 2021-10-04 LAB — HM COLONOSCOPY

## 2021-10-25 ENCOUNTER — Encounter: Payer: Self-pay | Admitting: Internal Medicine

## 2021-10-25 ENCOUNTER — Other Ambulatory Visit: Payer: Self-pay | Admitting: Internal Medicine

## 2021-10-25 NOTE — Progress Notes (Signed)
Outside notes received. Information abstracted. Notes sent to scan.  

## 2021-10-27 MED ORDER — AMPHETAMINE-DEXTROAMPHETAMINE 20 MG PO TABS: 30.0000 mg | ORAL_TABLET | Freq: Every day | ORAL | 0 refills | Status: DC

## 2021-11-03 ENCOUNTER — Ambulatory Visit: Payer: BC Managed Care – PPO | Admitting: Nurse Practitioner

## 2021-11-03 VITALS — BP 112/74 | HR 85 | Temp 98.0°F | Ht 64.0 in | Wt 130.0 lb

## 2021-11-03 DIAGNOSIS — W57XXXA Bitten or stung by nonvenomous insect and other nonvenomous arthropods, initial encounter: Secondary | ICD-10-CM | POA: Insufficient documentation

## 2021-11-03 DIAGNOSIS — H6502 Acute serous otitis media, left ear: Secondary | ICD-10-CM | POA: Insufficient documentation

## 2021-11-03 DIAGNOSIS — M542 Cervicalgia: Secondary | ICD-10-CM

## 2021-11-03 DIAGNOSIS — S00462A Insect bite (nonvenomous) of left ear, initial encounter: Secondary | ICD-10-CM

## 2021-11-03 MED ORDER — MUPIROCIN 2 % EX OINT: 1.0000 | TOPICAL_OINTMENT | Freq: Two times a day (BID) | CUTANEOUS | 0 refills | Status: DC

## 2021-11-03 MED ORDER — CYCLOBENZAPRINE HCL 10 MG PO TABS: ORAL_TABLET | ORAL | 1 refills | Status: DC

## 2021-11-03 NOTE — Assessment & Plan Note (Signed)
Small amounts of fluid noted behind left eardrum.  Otherwise no significant abnormalities noted to explain patient's pain.  Wonder if she may have some mild eustachian tube dysfunction occurring.  Recommend she treat with Flonase nasal spray and antihistamine over-the-counter.  We also discussed possibly trialing short course of prednisone.  She reports that she has a steroid Dosepak at home that she was prescribed previously for tendinitis of her heel.  She reports she will consider taking this. Patient was educated on side effects of prednisone including insomnia, increased risk of GI bleed, increased appetite, and irritability.  She was told to take medication in the morning with food and to avoid NSAID use while taking the medication.  The patient reports their understanding.  Patient told to follow-up if symptoms persist or worsen (defined as high fever, worsening or prolonged pain, or hearing loss).  She reports understanding.

## 2021-11-03 NOTE — Assessment & Plan Note (Signed)
Patient requesting refill on her Flexeril.  Refill prescribed today.

## 2021-11-03 NOTE — Assessment & Plan Note (Signed)
Skin behind the ear is slightly red, but does not look significantly infected.  Did prescribe mupirocin cream to be administered as needed twice a day while bite continues to heal.

## 2021-11-03 NOTE — Progress Notes (Signed)
Established Patient Office Visit  Subjective   Patient ID: Mariah Rodriguez, female    DOB: [birth year]  Age: 61 y.o. MRN: [id number]  Chief Complaint  Patient presents with   Ear pain    Left ear    Started approximately 2 weeks ago.  She describes the pain as sharp and intermittent however it is intense and score is 9/10.  She reports that it feels deep in her in her ear.  Has history of TMJ but reports this pain is different.  Prior to the pain she had a bug bite on the side of her head but this has gotten better.  She reports being at the beach about 2 weeks before her ear pain started.    Review of Systems  Constitutional:  Negative for fever.  HENT:  Positive for ear pain. Negative for ear discharge, hearing loss and tinnitus.       Objective:     BP 112/74 (BP Location: Right Arm, Patient Position: Sitting, Cuff Size: Large)   Pulse 85   Temp 98 F (36.7 C) (Oral)   Ht 5\' 4"  (1.626 m)   Wt 130 lb (59 kg)   SpO2 98%   BMI 22.31 kg/m    Physical Exam Vitals reviewed.  Constitutional:      General: She is not in acute distress.    Appearance: Normal appearance.  HENT:     Head: Normocephalic and atraumatic.      Right Ear: Hearing, tympanic membrane, ear canal and external ear normal. No middle ear effusion.     Left Ear: Hearing, ear canal and external ear normal. A middle ear effusion is present.  Neck:     Vascular: No carotid bruit.  Cardiovascular:     Rate and Rhythm: Normal rate and regular rhythm.     Pulses: Normal pulses.     Heart sounds: Normal heart sounds.  Pulmonary:     Effort: Pulmonary effort is normal.     Breath sounds: Normal breath sounds.  Skin:    General: Skin is warm and dry.  Neurological:     General: No focal deficit present.     Mental Status: She is alert and oriented to person, place, and time.  Psychiatric:        Mood and Affect: Mood normal.        Behavior: Behavior normal.        Judgment: Judgment normal.       No results found for any visits on 11/03/21.    The ASCVD Risk score (Arnett DK, et al., 2019) failed to calculate for the following reasons:   Cannot find a previous HDL lab   Cannot find a previous total cholesterol lab    Assessment & Plan:   Problem List Items Addressed This Visit       Nervous and Auditory   Non-recurrent acute serous otitis media of left ear    Small amounts of fluid noted behind left eardrum.  Otherwise no significant abnormalities noted to explain patient's pain.  Wonder if she may have some mild eustachian tube dysfunction occurring.  Recommend she treat with Flonase nasal spray and antihistamine over-the-counter.  We also discussed possibly trialing short course of prednisone.  She reports that she has a steroid Dosepak at home that she was prescribed previously for tendinitis of her heel.  She reports she will consider taking this. Patient was educated on side effects of prednisone including insomnia, increased risk of GI bleed, increased  appetite, and irritability.  She was told to take medication in the morning with food and to avoid NSAID use while taking the medication.  The patient reports their understanding.  Patient told to follow-up if symptoms persist or worsen (defined as high fever, worsening or prolonged pain, or hearing loss).  She reports understanding.         Other   Bug bite - Primary    Skin behind the ear is slightly red, but does not look significantly infected.  Did prescribe mupirocin cream to be administered as needed twice a day while bite continues to heal.      Relevant Medications   mupirocin ointment (BACTROBAN) 2 %   Neck pain    Patient requesting refill on her Flexeril.  Refill prescribed today.      Relevant Medications   cyclobenzaprine (FLEXERIL) 10 MG tablet    Return if symptoms worsen or fail to improve.    Elenore Paddy, NP

## 2021-12-01 ENCOUNTER — Other Ambulatory Visit: Payer: Self-pay | Admitting: Internal Medicine

## 2021-12-01 MED ORDER — AMPHETAMINE-DEXTROAMPHETAMINE 20 MG PO TABS: 30.0000 mg | ORAL_TABLET | Freq: Every day | ORAL | 0 refills | Status: DC

## 2021-12-31 ENCOUNTER — Other Ambulatory Visit: Payer: Self-pay | Admitting: Internal Medicine

## 2022-01-02 MED ORDER — AMPHETAMINE-DEXTROAMPHETAMINE 20 MG PO TABS: 30.0000 mg | ORAL_TABLET | Freq: Every day | ORAL | 0 refills | Status: DC

## 2022-02-08 ENCOUNTER — Other Ambulatory Visit: Payer: Self-pay | Admitting: Internal Medicine

## 2022-02-08 DIAGNOSIS — F411 Generalized anxiety disorder: Secondary | ICD-10-CM

## 2022-02-09 ENCOUNTER — Other Ambulatory Visit: Payer: Self-pay | Admitting: Internal Medicine

## 2022-02-09 MED ORDER — AMPHETAMINE-DEXTROAMPHETAMINE 20 MG PO TABS: 30.0000 mg | ORAL_TABLET | Freq: Every day | ORAL | 0 refills | Status: DC

## 2022-02-09 NOTE — Telephone Encounter (Signed)
Duplicate request already done.Marland KitchenJohny Rodriguez

## 2022-02-09 NOTE — Telephone Encounter (Signed)
Check [place] registry last filled 01/02/2022../l,mb

## 2022-02-13 ENCOUNTER — Encounter: Payer: Self-pay | Admitting: Nurse Practitioner

## 2022-02-14 ENCOUNTER — Other Ambulatory Visit: Payer: Self-pay

## 2022-02-14 MED ORDER — FINASTERIDE 5 MG PO TABS: 2.5000 mg | ORAL_TABLET | Freq: Every day | ORAL | 3 refills | Status: DC

## 2022-03-13 ENCOUNTER — Other Ambulatory Visit: Payer: Self-pay | Admitting: Internal Medicine

## 2022-03-13 DIAGNOSIS — F411 Generalized anxiety disorder: Secondary | ICD-10-CM

## 2022-03-13 DIAGNOSIS — M542 Cervicalgia: Secondary | ICD-10-CM

## 2022-03-14 MED ORDER — AMPHETAMINE-DEXTROAMPHETAMINE 20 MG PO TABS: 30.0000 mg | ORAL_TABLET | Freq: Every day | ORAL | 0 refills | Status: DC

## 2022-04-08 ENCOUNTER — Other Ambulatory Visit: Payer: Self-pay | Admitting: Internal Medicine

## 2022-04-08 DIAGNOSIS — F411 Generalized anxiety disorder: Secondary | ICD-10-CM

## 2022-05-05 ENCOUNTER — Other Ambulatory Visit: Payer: Self-pay | Admitting: Internal Medicine

## 2022-05-05 DIAGNOSIS — F411 Generalized anxiety disorder: Secondary | ICD-10-CM

## 2022-05-06 ENCOUNTER — Other Ambulatory Visit: Payer: Self-pay | Admitting: Internal Medicine

## 2022-05-06 DIAGNOSIS — M542 Cervicalgia: Secondary | ICD-10-CM

## 2022-05-10 ENCOUNTER — Other Ambulatory Visit: Payer: Self-pay | Admitting: Internal Medicine

## 2022-05-10 DIAGNOSIS — F411 Generalized anxiety disorder: Secondary | ICD-10-CM

## 2022-05-17 ENCOUNTER — Encounter: Payer: Self-pay | Admitting: Internal Medicine

## 2022-05-17 NOTE — Patient Instructions (Addendum)
Medications changes include :   none     Your prescriptions were sent to your pharmacy.

## 2022-05-17 NOTE — Progress Notes (Deleted)
Subjective:    Patient ID: Mariah Rodriguez, female    DOB: 1960-06-21, 62 y.o.   MRN: [id number]      HPI Mariah Rodriguez is here for a Physical exam.      Medications and allergies reviewed with patient and updated if appropriate.  Current Outpatient Medications on File Prior to Visit  Medication Sig Dispense Refill   amphetamine-dextroamphetamine (ADDERALL) 20 MG tablet Take 1.5 tablets (30 mg total) by mouth daily. Due for follow up - no additional refills will be given 45 tablet 0   cetirizine (ZYRTEC) 10 MG tablet Take 1 tablet (10 mg total) by mouth daily. 30 tablet 0   cyclobenzaprine (FLEXERIL) 10 MG tablet TAKE 1 TABLET BY MOUTH TWICE A DAY AS NEEDED 30 tablet 0   finasteride (PROSCAR) 5 MG tablet Take 0.5 tablets (2.5 mg total) by mouth daily. 45 tablet 3   FLUoxetine (PROZAC) 40 MG capsule TAKE 1 CAPSULE BY MOUTH EVERY DAY. Due for follow up appointment 30 capsule 0   fluticasone (FLONASE) 50 MCG/ACT nasal spray SPRAY 2 SPRAYS INTO EACH NOSTRIL EVERY DAY 16 g 2   mupirocin ointment (BACTROBAN) 2 % Apply 1 Application topically 2 (two) times daily. 22 g 0   tretinoin (RETIN-A) 0.1 % cream Apply topically at bedtime. 45 g 3   trimethoprim-polymyxin b (POLYTRIM) ophthalmic solution Place 1 drop into the left eye every 4 (four) hours. Use for 7 days 10 mL 0   No current facility-administered medications on file prior to visit.    Review of Systems     Objective:  There were no vitals filed for this visit. There were no vitals filed for this visit. There is no height or weight on file to calculate BMI.  BP Readings from Last 3 Encounters:  11/03/21 112/74  12/23/20 108/72  11/27/19 120/74    Wt Readings from Last 3 Encounters:  11/03/21 130 lb (59 kg)  12/23/20 129 lb 6.4 oz (58.7 kg)  11/27/19 135 lb (61.2 kg)       Physical Exam Constitutional: Mariah Rodriguez appears well-developed and well-nourished. No distress.  HENT:  Head: Normocephalic and atraumatic.  Right  Ear: External ear normal. Normal ear canal and TM Left Ear: External ear normal.  Normal ear canal and TM Mouth/Throat: Oropharynx is clear and moist.  Eyes: Conjunctivae normal.  Neck: Neck supple. No tracheal deviation present. No thyromegaly present.  No carotid bruit  Cardiovascular: Normal rate, regular rhythm and normal heart sounds.   No murmur heard.  No edema. Pulmonary/Chest: Effort normal and breath sounds normal. No respiratory distress. Mariah Rodriguez has no wheezes. Mariah Rodriguez has no rales.  Breast: deferred   Abdominal: Soft. Mariah Rodriguez exhibits no distension. There is no tenderness.  Lymphadenopathy: Mariah Rodriguez has no cervical adenopathy.  Skin: Skin is warm and dry. Mariah Rodriguez is not diaphoretic.  Psychiatric: Mariah Rodriguez has a normal mood and affect. Her behavior is normal.     Lab Results  Component Value Date   WBC 7.4 01/12/2014   HGB 14.3 01/12/2014   HCT 42.6 01/12/2014   PLT 250.0 01/12/2014   GLUCOSE 91 01/12/2014   CHOL 166 01/12/2014   TRIG 51.0 01/12/2014   HDL 68.90 01/12/2014   LDLCALC 87 01/12/2014   ALT 20 01/12/2014   AST 18 01/12/2014   NA 136 01/12/2014   K 4.4 01/12/2014   CL 103 01/12/2014   CREATININE 0.8 01/12/2014   BUN 12 01/12/2014   CO2 27 01/12/2014   TSH 1.25 01/12/2014  Assessment & Plan:   Physical exam: Screening blood work  ordered Exercise   Weight   Substance abuse  none   Reviewed recommended immunizations.   Health Maintenance  Topic Date Due   Zoster Vaccines- Shingrix (1 of 2) Never done   PAP SMEAR-Modifier  10/05/2016   MAMMOGRAM  01/13/2017   COVID-19 Vaccine (2 - 2023-24 season) 04/07/2022   DTaP/Tdap/Td (3 - Td or Tdap) 05/17/2022   Hepatitis C Screening  11/26/2057 (Originally 02/01/1980)   HIV Screening  11/27/2058 (Originally 01/31/1977)   Fecal DNA (Cologuard)  07/01/2024   INFLUENZA VACCINE  Completed   HPV VACCINES  Aged Out   COLONOSCOPY (Pts 45-18yrs Insurance coverage will need to be confirmed)  Discontinued           See Problem List for Assessment and Plan of chronic medical problems.

## 2022-05-18 ENCOUNTER — Ambulatory Visit (INDEPENDENT_AMBULATORY_CARE_PROVIDER_SITE_OTHER): Payer: Managed Care, Other (non HMO) | Admitting: Internal Medicine

## 2022-05-18 ENCOUNTER — Encounter: Payer: Self-pay | Admitting: Internal Medicine

## 2022-05-18 VITALS — BP 122/76 | HR 90 | Temp 98.0°F | Ht 64.0 in | Wt 138.0 lb

## 2022-05-18 DIAGNOSIS — F3289 Other specified depressive episodes: Secondary | ICD-10-CM | POA: Diagnosis not present

## 2022-05-18 DIAGNOSIS — F988 Other specified behavioral and emotional disorders with onset usually occurring in childhood and adolescence: Secondary | ICD-10-CM | POA: Diagnosis not present

## 2022-05-18 DIAGNOSIS — F411 Generalized anxiety disorder: Secondary | ICD-10-CM

## 2022-05-18 DIAGNOSIS — Z0000 Encounter for general adult medical examination without abnormal findings: Secondary | ICD-10-CM

## 2022-05-18 DIAGNOSIS — E559 Vitamin D deficiency, unspecified: Secondary | ICD-10-CM | POA: Diagnosis not present

## 2022-05-18 DIAGNOSIS — L659 Nonscarring hair loss, unspecified: Secondary | ICD-10-CM

## 2022-05-18 MED ORDER — FINASTERIDE 5 MG PO TABS: 2.5000 mg | ORAL_TABLET | Freq: Every day | ORAL | 3 refills | Status: DC

## 2022-05-18 MED ORDER — FLUOXETINE HCL 40 MG PO CAPS: ORAL_CAPSULE | ORAL | 3 refills | Status: DC

## 2022-05-18 MED ORDER — AMPHETAMINE-DEXTROAMPHETAMINE 20 MG PO TABS: 30.0000 mg | ORAL_TABLET | Freq: Every day | ORAL | 0 refills | Status: DC

## 2022-05-18 NOTE — Assessment & Plan Note (Deleted)
Chronic Taking vitamin D daily Check vitamin D level  

## 2022-05-18 NOTE — Assessment & Plan Note (Addendum)
Chronic  finasteride 2.5 mg daily-continue

## 2022-05-18 NOTE — Assessment & Plan Note (Addendum)
Chronic Continue fluoxetine 40 mg daily 

## 2022-05-18 NOTE — Assessment & Plan Note (Addendum)
Chronic Continue fluoxetine 40 mg daily

## 2022-05-18 NOTE — Progress Notes (Signed)
      Subjective:    Patient ID: Mariah Rodriguez, female    DOB: 08-04-1960, 62 y.o.   MRN: [id number]     HPI Essa is here for follow up of her chronic medical problems, including ADD,  anxiety, depression, hair loss, vit d def  She is very angry that she has to be here for refills.   She wants her refills.   Last seen by me 12/2020.  She was seen for an acute visit last summer.   Medications and allergies reviewed with patient and updated if appropriate.  Current Outpatient Medications on File Prior to Visit  Medication Sig Dispense Refill   amphetamine-dextroamphetamine (ADDERALL) 20 MG tablet Take 1.5 tablets (30 mg total) by mouth daily. Due for follow up - no additional refills will be given 45 tablet 0   cetirizine (ZYRTEC) 10 MG tablet Take 1 tablet (10 mg total) by mouth daily. 30 tablet 0   cyclobenzaprine (FLEXERIL) 10 MG tablet TAKE 1 TABLET BY MOUTH TWICE A DAY AS NEEDED 30 tablet 0   finasteride (PROSCAR) 5 MG tablet Take 0.5 tablets (2.5 mg total) by mouth daily. 45 tablet 3   FLUoxetine (PROZAC) 40 MG capsule TAKE 1 CAPSULE BY MOUTH EVERY DAY. Due for follow up appointment 30 capsule 0   fluticasone (FLONASE) 50 MCG/ACT nasal spray SPRAY 2 SPRAYS INTO EACH NOSTRIL EVERY DAY 16 g 2   tretinoin (RETIN-A) 0.1 % cream Apply topically at bedtime. 45 g 3   No current facility-administered medications on file prior to visit.     Review of Systems     Objective:   Vitals:   05/18/22 1311  BP: 122/76  Pulse: 90  Temp: 98 F (36.7 C)  SpO2: 98%   BP Readings from Last 3 Encounters:  05/18/22 122/76  11/03/21 112/74  12/23/20 108/72   Wt Readings from Last 3 Encounters:  05/18/22 138 lb (62.6 kg)  11/03/21 130 lb (59 kg)  12/23/20 129 lb 6.4 oz (58.7 kg)   Body mass index is 23.69 kg/m.    Physical Exam Constitutional:      Appearance: Normal appearance.  Neurological:     Mental Status: She is alert.  Psychiatric:     Comments: Very upset that  she needs to be here        Lab Results  Component Value Date   WBC 7.4 01/12/2014   HGB 14.3 01/12/2014   HCT 42.6 01/12/2014   PLT 250.0 01/12/2014   GLUCOSE 91 01/12/2014   CHOL 166 01/12/2014   TRIG 51.0 01/12/2014   HDL 68.90 01/12/2014   LDLCALC 87 01/12/2014   ALT 20 01/12/2014   AST 18 01/12/2014   NA 136 01/12/2014   K 4.4 01/12/2014   CL 103 01/12/2014   CREATININE 0.8 01/12/2014   BUN 12 01/12/2014   CO2 27 01/12/2014   TSH 1.25 01/12/2014     Assessment & Plan:    See Problem List for Assessment and Plan of chronic medical problems.

## 2022-05-18 NOTE — Assessment & Plan Note (Addendum)
Chronic Continue Adderall 30 mg daily

## 2022-06-27 ENCOUNTER — Other Ambulatory Visit: Payer: Self-pay | Admitting: Internal Medicine

## 2022-06-28 MED ORDER — AMPHETAMINE-DEXTROAMPHETAMINE 20 MG PO TABS: 30.0000 mg | ORAL_TABLET | Freq: Every day | ORAL | 0 refills | Status: DC

## 2022-07-02 ENCOUNTER — Other Ambulatory Visit: Payer: Self-pay | Admitting: Internal Medicine

## 2022-07-02 DIAGNOSIS — M542 Cervicalgia: Secondary | ICD-10-CM

## 2022-07-03 ENCOUNTER — Other Ambulatory Visit: Payer: Self-pay

## 2022-08-13 ENCOUNTER — Other Ambulatory Visit: Payer: Self-pay | Admitting: Physician Assistant

## 2022-08-13 ENCOUNTER — Telehealth: Payer: Managed Care, Other (non HMO) | Admitting: Physician Assistant

## 2022-08-13 ENCOUNTER — Other Ambulatory Visit (HOSPITAL_COMMUNITY): Payer: Self-pay

## 2022-08-13 DIAGNOSIS — L729 Follicular cyst of the skin and subcutaneous tissue, unspecified: Secondary | ICD-10-CM | POA: Diagnosis not present

## 2022-08-13 DIAGNOSIS — L089 Local infection of the skin and subcutaneous tissue, unspecified: Secondary | ICD-10-CM | POA: Diagnosis not present

## 2022-08-13 DIAGNOSIS — J069 Acute upper respiratory infection, unspecified: Secondary | ICD-10-CM

## 2022-08-13 MED ORDER — CEPHALEXIN 500 MG PO CAPS
500.0000 mg | ORAL_CAPSULE | Freq: Three times a day (TID) | ORAL | 0 refills | Status: AC
  Filled 2022-08-13: qty 21, 7d supply, fill #0

## 2022-08-13 MED ORDER — CETIRIZINE HCL 10 MG PO TABS
10.0000 mg | ORAL_TABLET | Freq: Every day | ORAL | 0 refills | Status: DC
  Filled 2022-08-13: qty 30, 30d supply, fill #0

## 2022-08-13 NOTE — Patient Instructions (Signed)
  Mariah Rodriguez, thank you for joining Mariah Climes, PA-C for today's virtual visit.  While this provider is not your primary care provider (PCP), if your PCP is located in our provider database this encounter information will be shared with them immediately following your visit.   A [facility] MyChart account gives you access to today's visit and all your visits, tests, and labs performed at [facility] " click here if you don't have a [facility] MyChart account or go to [email or web address]  Consent: (Patient) Mariah Rodriguez provided verbal consent for this virtual visit at the beginning of the encounter.  Current Medications:  Current Outpatient Medications:    amphetamine-dextroamphetamine (ADDERALL) 20 MG tablet, Take 1.5 tablets (30 mg total) by mouth daily., Disp: 45 tablet, Rfl: 0   cetirizine (ZYRTEC) 10 MG tablet, Take 1 tablet (10 mg total) by mouth daily., Disp: 30 tablet, Rfl: 0   cyclobenzaprine (FLEXERIL) 10 MG tablet, TAKE 1 TABLET BY MOUTH TWICE A DAY AS NEEDED, Disp: 30 tablet, Rfl: 0   finasteride (PROSCAR) 5 MG tablet, Take 0.5 tablets (2.5 mg total) by mouth daily., Disp: 45 tablet, Rfl: 3   FLUoxetine (PROZAC) 40 MG capsule, TAKE 1 CAPSULE BY MOUTH EVERY DAY., Disp: 90 capsule, Rfl: 3   fluticasone (FLONASE) 50 MCG/ACT nasal spray, SPRAY 2 SPRAYS INTO EACH NOSTRIL EVERY DAY, Disp: 16 g, Rfl: 2   tretinoin (RETIN-A) 0.1 % cream, Apply topically at bedtime., Disp: 45 g, Rfl: 3   Medications ordered in this encounter:  No orders of the defined types were placed in this encounter.    *If you need refills on other medications prior to your next appointment, please contact your pharmacy*  Follow-Up: Call back or seek an in-person evaluation if the symptoms worsen or if the condition fails to improve as anticipated.  [facility] [phone]  Other Instructions Please keep hydrated and rest. Alternate tylenol and ibuprofen if  needed. You can also alternate cold and hot compresses to the area. Take antibiotic as directed. Schedule in person follow-up for any non-resolving, new or worsening symptoms.   If you have been instructed to have an in-person evaluation today at a local Urgent Care facility, please use the link below. It will take you to a list of all of our available [facility], including address, phone number and hours of operation. Please do not delay care.  [facility]  If you or a family member do not have a primary care provider, use the link below to schedule a visit and establish care. When you choose a [facility] primary care physician or advanced practice provider, you gain a long-term partner in health. Find a Primary Care Provider  Learn more about [facility]'s in-office and virtual care options:  - Get Care Now

## 2022-08-13 NOTE — Progress Notes (Signed)
Virtual Visit Consent   Mariah Rodriguez, you are scheduled for a virtual visit with a [facility] provider today. Just as with appointments in the office, your consent must be obtained to participate. Your consent will be active for this visit and any virtual visit you may have with one of our providers in the next 365 days. If you have a MyChart account, a copy of this consent can be sent to you electronically.  As this is a virtual visit, video technology does not allow for your provider to perform a traditional examination. This may limit your provider's ability to fully assess your condition. If your provider identifies any concerns that need to be evaluated in person or the need to arrange testing (such as labs, EKG, etc.), we will make arrangements to do so. Although advances in technology are sophisticated, we cannot ensure that it will always work on either your end or our end. If the connection with a video visit is poor, the visit may have to be switched to a telephone visit. With either a video or telephone visit, we are not always able to ensure that we have a secure connection.  By engaging in this virtual visit, you consent to the provision of healthcare and authorize for your insurance to be billed (if applicable) for the services provided during this visit. Depending on your insurance coverage, you may receive a charge related to this service.  I need to obtain your verbal consent now. Are you willing to proceed with your visit today? Mariah Rodriguez has provided verbal consent on 08/13/2022 for a virtual visit (video or telephone). Piedad Climes, [place]  Date: 08/13/2022 12:12 PM  Virtual Visit via Video Note   I, Piedad Climes, connected with  Mariah Rodriguez  ([id number], 1958-08-03) on 08/13/22 at 12:15 PM EDT by a video-enabled telemedicine application and verified that I am speaking with the correct person using two identifiers.  Location: Patient: Virtual Visit Location  Patient: Home Provider: Virtual Visit Location Provider: Home Office   I discussed the limitations of evaluation and management by telemedicine and the availability of in person appointments. The patient expressed understanding and agreed to proceed.    History of Present Illness: Mariah Rodriguez is a 62 y.o. who identifies as a female who was assigned female at birth, and is being seen today for some swelling and tenderness in front of her left ear over the past day or so. Notes a palpable lump with tenderness. Is mobile and softer but not "squishy". Notes some sensitivity of surrounding skin. Denies ear pain, change in hearing, dizziness. Denies and posterior ear swelling or  palpable lesions. Able to fully open and close jaw without pain. 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 Denies symptoms of R ear. Denies fever, chills, or URI symptoms.   HPI: HPI  Problems:  Patient Active Problem List   Diagnosis Date Noted   Bug bite 11/03/2021   Neck pain 11/03/2021   Pulled hamstring 12/23/2020   Acute pain of left shoulder 08/24/2020   De Quervain's tenosynovitis 08/19/2018   Primary osteoarthritis of both first carpometacarpal joints 08/19/2018   Hair loss 06/03/2018   DDD (degenerative disc disease), cervical 09/03/2017   DDD (degenerative disc disease), lumbar 09/03/2017   Generalized anxiety disorder 01/21/2015   Depression 12/02/2008   NEUROPATHY, IDIOPATHIC PERIPHERAL 12/02/2008   TREMOR 12/02/2008   Vitamin D deficiency 10/26/2008   Attention deficit  disorder 10/21/2008    Allergies: No Known Allergies Medications:  Current Outpatient Medications:    cephALEXin (KEFLEX) 500 MG capsule, Take 1 capsule (500 mg total) by mouth 3 (three) times daily for 7 days., Disp: 21 capsule, Rfl: 0   amphetamine-dextroamphetamine (ADDERALL) 20 MG tablet, Take 1.5 tablets (30 mg total) by mouth daily., Disp: 45 tablet, Rfl: 0    cetirizine (ZYRTEC) 10 MG tablet, Take 1 tablet (10 mg total) by mouth daily., Disp: 30 tablet, Rfl: 0   cyclobenzaprine (FLEXERIL) 10 MG tablet, TAKE 1 TABLET BY MOUTH TWICE A DAY AS NEEDED, Disp: 30 tablet, Rfl: 0   finasteride (PROSCAR) 5 MG tablet, Take 0.5 tablets (2.5 mg total) by mouth daily., Disp: 45 tablet, Rfl: 3   FLUoxetine (PROZAC) 40 MG capsule, TAKE 1 CAPSULE BY MOUTH EVERY DAY., Disp: 90 capsule, Rfl: 3   fluticasone (FLONASE) 50 MCG/ACT nasal spray, SPRAY 2 SPRAYS INTO EACH NOSTRIL EVERY DAY, Disp: 16 g, Rfl: 2   tretinoin (RETIN-A) 0.1 % cream, Apply topically at bedtime., Disp: 45 g, Rfl: 3  Observations/Objective: Patient is well-developed, well-nourished in no acute distress.  Resting comfortably at home.  Head is normocephalic, atraumatic.  No labored breathing. Speech is clear and coherent with logical content.  Patient is alert and oriented at baseline.  Full ROM of mandible without change in pain.  Assessment and Plan: 1. Infected cyst of skin - cephALEXin (KEFLEX) 500 MG capsule; Take 1 capsule (500 mg total) by mouth 3 (three) times daily for 7 days.  Dispense: 21 capsule; Refill: 0  Suspected infected SQ cyst versus possible inflamed lymph node but no other signs/symptoms of infection elsewhere. Supportive measures and OTC medications reviewed. Start Keflex TID. In person follow-up recommended.   Follow Up Instructions: I discussed the assessment and treatment plan with the patient. The patient was provided an opportunity to ask questions and all were answered. The patient agreed with the plan and demonstrated an understanding of the instructions.  A copy of instructions were sent to the patient via MyChart unless otherwise noted below.   The patient was advised to call back or seek an in-person evaluation if the symptoms worsen or if the condition fails to improve as anticipated.  Time:  I spent 10 minutes with the patient via telehealth technology discussing  the above problems/concerns.    Piedad Climes, PA-C

## 2022-08-29 ENCOUNTER — Other Ambulatory Visit: Payer: Self-pay | Admitting: Internal Medicine

## 2022-08-30 MED ORDER — AMPHETAMINE-DEXTROAMPHETAMINE 20 MG PO TABS: 30.0000 mg | ORAL_TABLET | Freq: Every day | ORAL | 0 refills | Status: DC

## 2022-10-17 ENCOUNTER — Other Ambulatory Visit: Payer: Self-pay | Admitting: Internal Medicine

## 2022-10-17 MED ORDER — AMPHETAMINE-DEXTROAMPHETAMINE 20 MG PO TABS: 30.0000 mg | ORAL_TABLET | Freq: Every day | ORAL | 0 refills | Status: DC

## 2022-11-27 ENCOUNTER — Other Ambulatory Visit: Payer: Self-pay | Admitting: Internal Medicine

## 2022-11-28 MED ORDER — AMPHETAMINE-DEXTROAMPHETAMINE 20 MG PO TABS: 30.0000 mg | ORAL_TABLET | Freq: Every day | ORAL | 0 refills | Status: DC

## 2022-12-24 ENCOUNTER — Other Ambulatory Visit: Payer: Self-pay | Admitting: Internal Medicine

## 2022-12-25 MED ORDER — AMPHETAMINE-DEXTROAMPHETAMINE 20 MG PO TABS: 30.0000 mg | ORAL_TABLET | Freq: Every day | ORAL | 0 refills | Status: DC

## 2023-02-23 ENCOUNTER — Other Ambulatory Visit: Payer: Self-pay | Admitting: Internal Medicine

## 2023-02-23 ENCOUNTER — Other Ambulatory Visit: Payer: Self-pay | Admitting: Physician Assistant

## 2023-02-23 DIAGNOSIS — M542 Cervicalgia: Secondary | ICD-10-CM

## 2023-02-23 DIAGNOSIS — J069 Acute upper respiratory infection, unspecified: Secondary | ICD-10-CM

## 2023-02-23 MED ORDER — CETIRIZINE HCL 10 MG PO TABS: 10.0000 mg | ORAL_TABLET | Freq: Every day | ORAL | 0 refills | Status: DC

## 2023-02-26 MED ORDER — AMPHETAMINE-DEXTROAMPHETAMINE 20 MG PO TABS: 30.0000 mg | ORAL_TABLET | Freq: Every day | ORAL | 0 refills | Status: DC

## 2023-02-26 MED ORDER — CYCLOBENZAPRINE HCL 10 MG PO TABS: ORAL_TABLET | ORAL | 0 refills | Status: DC

## 2023-03-19 ENCOUNTER — Other Ambulatory Visit: Payer: Self-pay | Admitting: Internal Medicine

## 2023-03-19 DIAGNOSIS — J069 Acute upper respiratory infection, unspecified: Secondary | ICD-10-CM

## 2023-03-26 ENCOUNTER — Other Ambulatory Visit: Payer: Self-pay | Admitting: Internal Medicine

## 2023-03-27 MED ORDER — AMPHETAMINE-DEXTROAMPHETAMINE 20 MG PO TABS: 30.0000 mg | ORAL_TABLET | Freq: Every day | ORAL | 0 refills | Status: DC

## 2023-04-18 ENCOUNTER — Other Ambulatory Visit: Payer: Self-pay | Admitting: Internal Medicine

## 2023-04-18 DIAGNOSIS — M542 Cervicalgia: Secondary | ICD-10-CM

## 2023-05-22 ENCOUNTER — Other Ambulatory Visit: Payer: Self-pay | Admitting: Internal Medicine

## 2023-05-22 DIAGNOSIS — F411 Generalized anxiety disorder: Secondary | ICD-10-CM

## 2023-06-05 ENCOUNTER — Other Ambulatory Visit: Payer: Self-pay | Admitting: Internal Medicine

## 2023-06-12 ENCOUNTER — Other Ambulatory Visit: Payer: Self-pay | Admitting: Internal Medicine

## 2023-06-12 DIAGNOSIS — F411 Generalized anxiety disorder: Secondary | ICD-10-CM

## 2023-06-14 ENCOUNTER — Telehealth: Payer: Self-pay | Admitting: Nurse Practitioner

## 2023-06-14 ENCOUNTER — Ambulatory Visit: Payer: Managed Care, Other (non HMO) | Admitting: Nurse Practitioner

## 2023-06-14 ENCOUNTER — Encounter: Payer: Self-pay | Admitting: Nurse Practitioner

## 2023-06-14 ENCOUNTER — Other Ambulatory Visit: Payer: Self-pay | Admitting: Nurse Practitioner

## 2023-06-14 VITALS — BP 100/68 | HR 75 | Temp 97.7°F | Ht 64.0 in | Wt 144.4 lb

## 2023-06-14 DIAGNOSIS — F988 Other specified behavioral and emotional disorders with onset usually occurring in childhood and adolescence: Secondary | ICD-10-CM

## 2023-06-14 DIAGNOSIS — L659 Nonscarring hair loss, unspecified: Secondary | ICD-10-CM

## 2023-06-14 DIAGNOSIS — Z13220 Encounter for screening for lipoid disorders: Secondary | ICD-10-CM | POA: Diagnosis not present

## 2023-06-14 DIAGNOSIS — Z136 Encounter for screening for cardiovascular disorders: Secondary | ICD-10-CM

## 2023-06-14 DIAGNOSIS — F411 Generalized anxiety disorder: Secondary | ICD-10-CM

## 2023-06-14 DIAGNOSIS — J069 Acute upper respiratory infection, unspecified: Secondary | ICD-10-CM | POA: Insufficient documentation

## 2023-06-14 DIAGNOSIS — M542 Cervicalgia: Secondary | ICD-10-CM

## 2023-06-14 DIAGNOSIS — Z131 Encounter for screening for diabetes mellitus: Secondary | ICD-10-CM | POA: Diagnosis not present

## 2023-06-14 DIAGNOSIS — E875 Hyperkalemia: Secondary | ICD-10-CM

## 2023-06-14 LAB — COMPREHENSIVE METABOLIC PANEL
ALT: 17 U/L (ref 0–35)
AST: 19 U/L (ref 0–37)
Albumin: 4.2 g/dL (ref 3.5–5.2)
Alkaline Phosphatase: 78 U/L (ref 39–117)
BUN: 13 mg/dL (ref 6–23)
CO2: 28 meq/L (ref 19–32)
Calcium: 9.2 mg/dL (ref 8.4–10.5)
Chloride: 103 meq/L (ref 96–112)
Creatinine, Ser: 0.96 mg/dL (ref 0.40–1.20)
GFR: 63.92 mL/min (ref >60.00)
Glucose, Bld: 100 mg/dL — ABNORMAL HIGH (ref 70–99)
Potassium: 5.4 meq/L — ABNORMAL HIGH (ref 3.5–5.1)
Sodium: 138 meq/L (ref 135–145)
Total Bilirubin: 0.4 mg/dL (ref 0.2–1.2)
Total Protein: 7.3 g/dL (ref 6.0–8.3)

## 2023-06-14 LAB — CBC
HCT: 42.3 % (ref 36.0–46.0)
Hemoglobin: 14 g/dL (ref 12.0–15.0)
MCHC: 33.1 g/dL (ref 30.0–36.0)
MCV: 94.8 fL (ref 78.0–100.0)
Platelets: 311 10*3/uL (ref 150.0–400.0)
RBC: 4.46 Mil/uL (ref 3.87–5.11)
RDW: 13.9 % (ref 11.5–15.5)
WBC: 6.3 10*3/uL (ref 4.0–10.5)

## 2023-06-14 LAB — LIPID PANEL
Cholesterol: 195 mg/dL (ref 0–200)
HDL: 78.9 mg/dL (ref >39.00)
LDL Cholesterol: 100 mg/dL — ABNORMAL HIGH (ref 0–99)
NonHDL: 116.53
Total CHOL/HDL Ratio: 2
Triglycerides: 81 mg/dL (ref 0.0–149.0)
VLDL: 16.2 mg/dL (ref 0.0–40.0)

## 2023-06-14 LAB — HEMOGLOBIN A1C: Hgb A1c MFr Bld: 6 % (ref 4.6–6.5)

## 2023-06-14 MED ORDER — FINASTERIDE 5 MG PO TABS: 2.5000 mg | ORAL_TABLET | Freq: Every day | ORAL | 0 refills | Status: DC

## 2023-06-14 MED ORDER — CETIRIZINE HCL 10 MG PO TABS: 10.0000 mg | ORAL_TABLET | Freq: Every day | ORAL | 0 refills | Status: DC

## 2023-06-14 MED ORDER — FLUTICASONE PROPIONATE 50 MCG/ACT NA SUSP: NASAL | 2 refills | Status: DC

## 2023-06-14 MED ORDER — AMPHETAMINE-DEXTROAMPHET ER 30 MG PO CP24: 30.0000 mg | ORAL_CAPSULE | Freq: Every day | ORAL | 0 refills | Status: DC

## 2023-06-14 MED ORDER — FLUOXETINE HCL 40 MG PO CAPS: 40.0000 mg | ORAL_CAPSULE | Freq: Every day | ORAL | 0 refills | Status: DC

## 2023-06-14 MED ORDER — TRETINOIN 0.1 % EX CREA: TOPICAL_CREAM | Freq: Every day | CUTANEOUS | 3 refills | Status: AC

## 2023-06-14 MED ORDER — CYCLOBENZAPRINE HCL 10 MG PO TABS: 10.0000 mg | ORAL_TABLET | Freq: Two times a day (BID) | ORAL | 2 refills | Status: DC | PRN

## 2023-06-14 NOTE — Assessment & Plan Note (Signed)
 Labs ordered, further recommendations may be made based upon his results.

## 2023-06-14 NOTE — Telephone Encounter (Signed)
 I saw this patient today for follow-up. I have discussed the importance of following up with PCP on a regular basis during her visit today. I also discussed this patient and her plan of care with her PCP (Dr. Lawerance Bach), PCP would like to see patient within 90 days for continuity of care in addition to assessing patient's response to change in adderall from immediate release to extended release formulation. Patient stated during office visit with me that communication via mychart message is acceptable to her. I will notify patient of this via mychart message as well.

## 2023-06-14 NOTE — Assessment & Plan Note (Signed)
 Chronic, stable Fluoxetine 40 mg capsules, take 1 capsule by mouth daily.  Refill sent to pharmacy.

## 2023-06-14 NOTE — Assessment & Plan Note (Signed)
 Chronic, intermittent Flexeril 10 mg tablet, take 1 tablet twice a day as needed.  Refill sent to pharmacy today.

## 2023-06-14 NOTE — Assessment & Plan Note (Signed)
 Chronic, stable Finasteride 5 mg tablet, take 1/2 tablet by mouth daily.  Refill sent to pharmacy today.

## 2023-06-14 NOTE — Assessment & Plan Note (Signed)
 Acute, intermittent Refill for Flonase nasal spray sent to patient's pharmacy.  Patient is due for labs including metabolic panel to check kidney function.  Pending these results we will send in refill of cetirizine 10 mg that she can take by mouth once a day as needed.

## 2023-06-14 NOTE — Progress Notes (Signed)
 Estimated Creatinine Clearance: 53.1 mL/min (by C-G formula based on SCr of 0.96 mg/dL).

## 2023-06-14 NOTE — Assessment & Plan Note (Addendum)
 Chronic, stable Does report medication effect wears off in early afternoon and would like to trial extended release format.  I did discuss with her PCP who is agreeable to this.  Patient is due for labs, pending metabolic panel and kidney function results will send in Adderall XR 15mg  capsules and she will be directed to take 2 capsules by mouth once a day. She is to reach out to office with any adverse side effects, questions, and/or concerns.

## 2023-06-14 NOTE — Addendum Note (Signed)
 Addended by: Elenore Paddy on: 06/14/2023 12:59 PM   Modules accepted: Orders

## 2023-06-14 NOTE — Progress Notes (Addendum)
 Established Patient Office Visit  Subjective   Patient ID: Mariah Rodriguez, female    DOB: [birth year]  Age: 63 y.o. MRN: [id number]  Chief Complaint  Patient presents with   Medication Refill    Patient arrives today for follow-up to discuss chronic medical conditions and refill medications.  Upper respiratory infection: Will take Flonase and Zyrtec as needed for upper respiratory infection.  Requesting refill today. ADD: Reports having taking Adderall 30 mg daily for "years".  She reports she is tolerating medication well and denies any chest pain or cardiac palpitations.  No evidence of hypertension or elevated blood pressure identified on recent chart review.  She reports she still finds benefit in taking medication including improvement in ability to focus and concentrate, however she notices that the effect of the medication starts to wear off in the early afternoon and she would like to discuss possibly taking an extended release version. GAD: Reports that her mood is stable, continues on fluoxetine 40 mg daily.  Would like refill on this today.  Does report that coming to the doctor's office is a significant trigger for her anxiety.  Would prefer to only come once a year if possible. Hair loss: Continues on finasteride 2.5 mg daily, tolerating medication well.  Would like refill today. Neck pain: Chronic, will take Flexeril 10 mg twice a day as needed.  Would like refill today.     ROS: see HPI    Objective:     BP 100/68   Pulse 75   Temp 97.7 F (36.5 C) (Temporal)   Ht 5\' 4"  (1.626 m)   Wt 144 lb 6 oz (65.5 kg)   SpO2 100%   BMI 24.78 kg/m  BP Readings from Last 3 Encounters:  06/14/23 100/68  05/18/22 122/76  11/03/21 112/74   Wt Readings from Last 3 Encounters:  06/14/23 144 lb 6 oz (65.5 kg)  05/18/22 138 lb (62.6 kg)  11/03/21 130 lb (59 kg)       Physical Exam Vitals reviewed.  Constitutional:      General: She is not in acute distress.     Appearance: Normal appearance.  HENT:     Head: Normocephalic and atraumatic.  Cardiovascular:     Rate and Rhythm: Normal rate and regular rhythm.     Pulses: Normal pulses.     Heart sounds: Normal heart sounds.  Pulmonary:     Effort: Pulmonary effort is normal.     Breath sounds: Normal breath sounds.  Skin:    General: Skin is warm and dry.  Neurological:     General: No focal deficit present.     Mental Status: She is alert and oriented to person, place, and time.  Psychiatric:        Mood and Affect: Mood normal.        Behavior: Behavior normal.        Judgment: Judgment normal.      No results found for any visits on 06/14/23.    The ASCVD Risk score (Arnett DK, et al., 2019) failed to calculate for the following reasons:   Cannot find a previous HDL lab   Cannot find a previous total cholesterol lab    Assessment & Plan:   Problem List Items Addressed This Visit       Respiratory   Acute URI   Acute, intermittent Refill for Flonase nasal spray sent to patient's pharmacy.  Patient is due for labs including metabolic panel to check kidney function.  Pending these results we will send in refill of cetirizine 10 mg that she can take by mouth once a day as needed.      Relevant Medications   fluticasone (FLONASE) 50 MCG/ACT nasal spray   Other Relevant Orders   Comprehensive metabolic panel   CBC   Hemoglobin A1c   Lipid panel     Other   Attention deficit disorder   Chronic, stable Does report medication effect wears off in early afternoon and would like to trial extended release format.  I did discuss with her PCP who is agreeable to this.  Patient is due for labs, pending metabolic panel and kidney function results will send in Adderall XR 15mg  capsules and she will be directed to take 2 capsules by mouth once a day. She is to reach out to office with any adverse side effects, questions, and/or concerns.       Generalized anxiety disorder   Chronic,  stable Fluoxetine 40 mg capsules, take 1 capsule by mouth daily.  Refill sent to pharmacy.      Relevant Medications   FLUoxetine (PROZAC) 40 MG capsule   Hair loss   Chronic, stable Finasteride 5 mg tablet, take 1/2 tablet by mouth daily.  Refill sent to pharmacy today.      Relevant Medications   finasteride (PROSCAR) 5 MG tablet   Neck pain   Chronic, intermittent Flexeril 10 mg tablet, take 1 tablet twice a day as needed.  Refill sent to pharmacy today.      Relevant Medications   cyclobenzaprine (FLEXERIL) 10 MG tablet   Other Relevant Orders   Comprehensive metabolic panel   CBC   Hemoglobin A1c   Lipid panel   Encounter for lipid screening for cardiovascular disease   Labs ordered, further recommendations may be made based upon his results       Relevant Orders   Comprehensive metabolic panel   CBC   Hemoglobin A1c   Lipid panel   Diabetes mellitus screening - Primary   Labs ordered, further recommendations may be made based upon his results       Relevant Orders   Comprehensive metabolic panel   CBC   Hemoglobin A1c   Lipid panel   Of note, we also discussed the importance of following with PCP on a regular basis.  Unfortunately, there was miscommunication with front office staff as patient thought this visit with myself today would count as her yearly exam with her PCP.  I have discussed this case with her PCP, and PCP would still like to see patient within the next 90 days for continuity of care.  Patient will be notified of this as well.  Return in about 3 months (around 09/11/2023) for f/u with PCP.    Elenore Paddy, NP

## 2023-07-15 ENCOUNTER — Other Ambulatory Visit: Payer: Self-pay | Admitting: Nurse Practitioner

## 2023-07-15 DIAGNOSIS — F988 Other specified behavioral and emotional disorders with onset usually occurring in childhood and adolescence: Secondary | ICD-10-CM

## 2023-07-18 MED ORDER — AMPHETAMINE-DEXTROAMPHET ER 30 MG PO CP24: 30.0000 mg | ORAL_CAPSULE | Freq: Every day | ORAL | 0 refills | Status: DC

## 2023-08-07 ENCOUNTER — Encounter: Payer: Self-pay | Admitting: Internal Medicine

## 2023-08-07 DIAGNOSIS — R7303 Prediabetes: Secondary | ICD-10-CM | POA: Insufficient documentation

## 2023-08-07 NOTE — Patient Instructions (Addendum)
      Medications changes include :   changed adderall to 30 mg , adderall 5 mg daily as needed    Your prescriptions have been sent to your pharmacy.    Return in about 1 year (around 08/07/2024) for follow up.

## 2023-08-07 NOTE — Progress Notes (Unsigned)
      Subjective:    Patient ID: Mariah Rodriguez, female    DOB: 07-13-1960, 63 y.o.   MRN: [id number]     HPI Min is here for follow up of her chronic medical problems.  Overall doing well.  Has no concerns.  Needs to get refills for her medication.  She was recently here and saw Isa Manuel and had her exam and blood work done.  Medications and allergies reviewed with patient and updated if appropriate.  Current Outpatient Medications on File Prior to Visit  Medication Sig Dispense Refill   cetirizine (ZYRTEC) 10 MG tablet Take 1 tablet (10 mg total) by mouth daily. 90 tablet 0   cyclobenzaprine (FLEXERIL) 10 MG tablet Take 1 tablet (10 mg total) by mouth 2 (two) times daily as needed. 60 tablet 2   fluticasone (FLONASE) 50 MCG/ACT nasal spray SPRAY 2 SPRAYS INTO EACH NOSTRIL EVERY DAY 16 g 2   tretinoin (RETIN-A) 0.1 % cream Apply topically at bedtime. 45 g 3   No current facility-administered medications on file prior to visit.     Review of Systems  Respiratory:  Negative for cough, shortness of breath and wheezing.   Cardiovascular:  Negative for chest pain, palpitations and leg swelling.  Neurological:  Negative for light-headedness and headaches.       Objective:   Vitals:   08/08/23 0955  BP: 108/74  Pulse: 69  Temp: 98 F (36.7 C)  SpO2: 99%   BP Readings from Last 3 Encounters:  08/08/23 108/74  06/14/23 100/68  05/18/22 122/76   Wt Readings from Last 3 Encounters:  08/08/23 140 lb (63.5 kg)  06/14/23 144 lb 6 oz (65.5 kg)  05/18/22 138 lb (62.6 kg)   Body mass index is 24.03 kg/m.    Physical Exam Constitutional:      General: She is not in acute distress.    Appearance: Normal appearance. She is not ill-appearing.  HENT:     Head: Normocephalic and atraumatic.  Skin:    General: Skin is warm and dry.  Neurological:     Mental Status: She is alert. Mental status is at baseline.  Psychiatric:        Mood and Affect: Mood normal.         Behavior: Behavior normal.        Thought Content: Thought content normal.        Judgment: Judgment normal.        Lab Results  Component Value Date   WBC 6.3 06/14/2023   HGB 14.0 06/14/2023   HCT 42.3 06/14/2023   PLT 311.0 06/14/2023   GLUCOSE 100 (H) 06/14/2023   CHOL 195 06/14/2023   TRIG 81.0 06/14/2023   HDL 78.90 06/14/2023   LDLCALC 100 (H) 06/14/2023   ALT 17 06/14/2023   AST 19 06/14/2023   NA 138 06/14/2023   K 5.4 No hemolysis seen (H) 06/14/2023   CL 103 06/14/2023   CREATININE 0.96 06/14/2023   BUN 13 06/14/2023   CO2 28 06/14/2023   TSH 1.25 01/12/2014   HGBA1C 6.0 06/14/2023     Assessment & Plan:    See Problem List for Assessment and Plan of chronic medical problems.

## 2023-08-08 ENCOUNTER — Ambulatory Visit: Admitting: Internal Medicine

## 2023-08-08 VITALS — BP 108/74 | HR 69 | Temp 98.0°F | Ht 64.0 in | Wt 140.0 lb

## 2023-08-08 DIAGNOSIS — F3289 Other specified depressive episodes: Secondary | ICD-10-CM

## 2023-08-08 DIAGNOSIS — R7303 Prediabetes: Secondary | ICD-10-CM

## 2023-08-08 DIAGNOSIS — F988 Other specified behavioral and emotional disorders with onset usually occurring in childhood and adolescence: Secondary | ICD-10-CM | POA: Diagnosis not present

## 2023-08-08 DIAGNOSIS — L659 Nonscarring hair loss, unspecified: Secondary | ICD-10-CM | POA: Diagnosis not present

## 2023-08-08 DIAGNOSIS — F411 Generalized anxiety disorder: Secondary | ICD-10-CM

## 2023-08-08 MED ORDER — FLUOXETINE HCL 40 MG PO CAPS: 40.0000 mg | ORAL_CAPSULE | Freq: Every day | ORAL | 2 refills | Status: DC

## 2023-08-08 MED ORDER — FINASTERIDE 5 MG PO TABS
2.5000 mg | ORAL_TABLET | Freq: Every day | ORAL | 2 refills | Status: AC
Start: 2023-08-08 — End: ?

## 2023-08-08 MED ORDER — GABAPENTIN 100 MG PO CAPS
100.0000 mg | ORAL_CAPSULE | Freq: Three times a day (TID) | ORAL | 3 refills | Status: AC | PRN
Start: 2023-08-08 — End: ?

## 2023-08-08 MED ORDER — AMPHETAMINE-DEXTROAMPHETAMINE 5 MG PO TABS: 5.0000 mg | ORAL_TABLET | Freq: Every day | ORAL | 0 refills | Status: DC | PRN

## 2023-08-08 MED ORDER — AMPHETAMINE-DEXTROAMPHETAMINE 30 MG PO TABS: 30.0000 mg | ORAL_TABLET | Freq: Every day | ORAL | 0 refills | Status: DC

## 2023-08-08 NOTE — Assessment & Plan Note (Addendum)
 New Lab Results  Component Value Date   HGBA1C 6.0 06/14/2023   Will repeat in a year.  She is already made changes to her diet

## 2023-08-08 NOTE — Assessment & Plan Note (Addendum)
 Chronic Controlled Adderall XR does seem to affect her sleep and she is interested in changing back to the immediate release Start Adderall 30 mg daily Will also start Adderall 5 mg daily as needed-she can take this in the afternoon if she feels like she needs a little bit of a boost to help her get through the day

## 2023-08-08 NOTE — Assessment & Plan Note (Signed)
 Chronic Continue finasteride 2.5 mg daily

## 2023-08-08 NOTE — Assessment & Plan Note (Signed)
 Chronic controlled Continue fluoxetine 40 mg daily

## 2023-08-08 NOTE — Assessment & Plan Note (Signed)
Chronic Controlled Continue fluoxetine 40 mg daily

## 2023-09-26 ENCOUNTER — Other Ambulatory Visit: Payer: Self-pay | Admitting: Internal Medicine

## 2023-09-26 MED ORDER — AMPHETAMINE-DEXTROAMPHETAMINE 30 MG PO TABS: 30.0000 mg | ORAL_TABLET | Freq: Every day | ORAL | 0 refills | Status: DC

## 2023-09-26 NOTE — Telephone Encounter (Unsigned)
 Copied from CRM [id number]. Topic: Clinical - Medication Refill >> Sep 26, 2023 10:07 AM Bridgette Campus T wrote: Medication: amphetamine -dextroamphetamine  (ADDERALL) 30 MG tablet  Has the patient contacted their pharmacy? Yes (Agent: If no, request that the patient contact the pharmacy for the refill. If patient does not wish to contact the pharmacy document the reason why and proceed with request.) (Agent: If yes, when and what did the pharmacy advise?)  This is the patient's preferred pharmacy:  [id number] [place], [place] - [address] [place] [address] Phone: [phone] Fax: [phone]    Is this the correct pharmacy for this prescription? Yes If no, delete pharmacy and type the correct one.   Has the prescription been filled recently? Yes  Is the patient out of the medication? Yes  Has the patient been seen for an appointment in the last year OR does the patient have an upcoming appointment? Yes  Can we respond through MyChart? Yes  Agent: Please be advised that Rx refills may take up to 3 business days. We ask that you follow-up with your pharmacy.

## 2023-11-05 ENCOUNTER — Other Ambulatory Visit: Payer: Self-pay | Admitting: Internal Medicine

## 2023-11-05 MED ORDER — AMPHETAMINE-DEXTROAMPHETAMINE 30 MG PO TABS: 30.0000 mg | ORAL_TABLET | Freq: Every day | ORAL | 0 refills | Status: DC

## 2023-11-05 NOTE — Telephone Encounter (Signed)
 Copied from CRM [id number]. Topic: Clinical - Medication Refill >> Nov 05, 2023  8:56 AM Precious C wrote: Medication:  amphetamine -dextroamphetamine  (ADDERALL) 30 MG tablet   Has the patient contacted their pharmacy? Yes (Agent: If no, request that the patient contact the pharmacy for the refill. If patient does not wish to contact the pharmacy document the reason why and proceed with request.) (Agent: If yes, when and what did the pharmacy advise?)Today and advise to call RX refill in  This is the patient's preferred pharmacy:  [id number] [place], [place] - [address] [place] [address] Phone: [phone] Fax: [phone]   Is this the correct pharmacy for this prescription? Yes If no, delete pharmacy and type the correct one.   Has the prescription been filled recently? No  Is the patient out of the medication? Yes  Has the patient been seen for an appointment in the last year OR does the patient have an upcoming appointment? Yes  Can we respond through MyChart? Yes  Agent: Please be advised that Rx refills may take up to 3 business days. We ask that you follow-up with your pharmacy.

## 2023-11-05 NOTE — Telephone Encounter (Signed)
 09/26/23 30 tabs/0 RF

## 2023-11-12 ENCOUNTER — Other Ambulatory Visit: Payer: Self-pay | Admitting: Nurse Practitioner

## 2023-11-12 DIAGNOSIS — M542 Cervicalgia: Secondary | ICD-10-CM

## 2023-12-05 ENCOUNTER — Other Ambulatory Visit: Payer: Self-pay | Admitting: Nurse Practitioner

## 2023-12-05 DIAGNOSIS — J069 Acute upper respiratory infection, unspecified: Secondary | ICD-10-CM

## 2023-12-17 ENCOUNTER — Other Ambulatory Visit: Payer: Self-pay | Admitting: Internal Medicine

## 2023-12-17 NOTE — Telephone Encounter (Signed)
 Copied from CRM [id number]. Topic: Clinical - Medication Refill >> Dec 17, 2023  3:57 PM Pinkey ORN wrote: Medication: amphetamine -dextroamphetamine  (ADDERALL) 30 MG tablet  Has the patient contacted their pharmacy? Yes (Agent: If no, request that the patient contact the pharmacy for the refill. If patient does not wish to contact the pharmacy document the reason why and proceed with request.) (Agent: If yes, when and what did the pharmacy advise?)  This is the patient's preferred pharmacy:  [id number] [place], [place] - [address] [place] [address] Phone: [phone] Fax: [phone]   Is this the correct pharmacy for this prescription? Yes If no, delete pharmacy and type the correct one.   Has the prescription been filled recently? No  Is the patient out of the medication? Yes  Has the patient been seen for an appointment in the last year OR does the patient have an upcoming appointment? Yes  Can we respond through MyChart? Yes  Agent: Please be advised that Rx refills may take up to 3 business days. We ask that you follow-up with your pharmacy.

## 2023-12-18 MED ORDER — AMPHETAMINE-DEXTROAMPHETAMINE 30 MG PO TABS: 30.0000 mg | ORAL_TABLET | Freq: Every day | ORAL | 0 refills | Status: DC

## 2023-12-21 ENCOUNTER — Other Ambulatory Visit: Payer: Self-pay | Admitting: Nurse Practitioner

## 2023-12-21 DIAGNOSIS — J069 Acute upper respiratory infection, unspecified: Secondary | ICD-10-CM

## 2024-01-17 ENCOUNTER — Encounter

## 2024-01-17 ENCOUNTER — Encounter: Payer: Self-pay | Admitting: Internal Medicine

## 2024-01-17 DIAGNOSIS — R682 Dry mouth, unspecified: Secondary | ICD-10-CM

## 2024-01-19 DIAGNOSIS — R682 Dry mouth, unspecified: Secondary | ICD-10-CM | POA: Insufficient documentation

## 2024-01-19 MED ORDER — PILOCARPINE HCL 5 MG PO TABS: 5.0000 mg | ORAL_TABLET | Freq: Three times a day (TID) | ORAL | 5 refills | Status: AC

## 2024-02-04 ENCOUNTER — Other Ambulatory Visit: Payer: Self-pay | Admitting: Internal Medicine

## 2024-02-04 MED ORDER — AMPHETAMINE-DEXTROAMPHETAMINE 30 MG PO TABS: 30.0000 mg | ORAL_TABLET | Freq: Every day | ORAL | 0 refills | Status: DC

## 2024-02-04 NOTE — Telephone Encounter (Unsigned)
 Copied from CRM [id number]. Topic: Clinical - Medication Refill >> Feb 04, 2024 11:21 AM Roselie BROCKS wrote: Medication: amphetamine -dextroamphetamine  (ADDERALL) 30 MG tablet  Has the patient contacted their pharmacy? Yes (Agent: If no, request that the patient contact the pharmacy for the refill. If patient does not wish to contact the pharmacy document the reason why and proceed with request.) (Agent: If yes, when and what did the pharmacy advise?)  This is the patient's preferred pharmacy:  [id number] [place], [place] - [address] [[id number]]   Is this the correct pharmacy for this prescription? Yes If no, delete pharmacy and type the correct one.   Has the prescription been filled recently? Yes  Is the patient out of the medication? Yes  Has the patient been seen for an appointment in the last year OR does the patient have an upcoming appointment? Yes  Can we respond through MyChart? Yes  Agent: Please be advised that Rx refills may take up to 3 business days. We ask that you follow-up with your pharmacy.

## 2024-02-27 ENCOUNTER — Other Ambulatory Visit: Payer: Self-pay | Admitting: Family

## 2024-02-27 DIAGNOSIS — M542 Cervicalgia: Secondary | ICD-10-CM

## 2024-03-16 ENCOUNTER — Other Ambulatory Visit: Payer: Self-pay | Admitting: Internal Medicine

## 2024-03-16 NOTE — Telephone Encounter (Unsigned)
 Copied from CRM #[id number]. Topic: Clinical - Medication Refill >> Mar 16, 2024  2:53 PM Victoria A wrote: Medication: amphetamine -dextroamphetamine  (ADDERALL) 30 MG tablet; amphetamine -dextroamphetamine  (ADDERALL) 5 MG tablet  Has the patient contacted their pharmacy? No (Agent: If no, request that the patient contact the pharmacy for the refill. If patient does not wish to contact the pharmacy document the reason why and proceed with request.) (Agent: If yes, when and what did the pharmacy advise?)  This is the patient's preferred pharmacy:  [id number] [place], [place] - [address] [place] [address] Phone: [phone] Fax: [phone]  Is this the correct pharmacy for this prescription? Yes If no, delete pharmacy and type the correct one.   Has the prescription been filled recently? No  Is the patient out of the medication? Yes  Has the patient been seen for an appointment in the last year OR does the patient have an upcoming appointment? No  Can we respond through MyChart? Yes  Agent: Please be advised that Rx refills may take up to 3 business days. We ask that you follow-up with your pharmacy.

## 2024-03-17 MED ORDER — AMPHETAMINE-DEXTROAMPHETAMINE 30 MG PO TABS: 30.0000 mg | ORAL_TABLET | Freq: Every day | ORAL | 0 refills | Status: DC

## 2024-03-17 MED ORDER — AMPHETAMINE-DEXTROAMPHETAMINE 5 MG PO TABS: 5.0000 mg | ORAL_TABLET | Freq: Every day | ORAL | 0 refills | Status: AC | PRN

## 2024-03-28 ENCOUNTER — Other Ambulatory Visit: Payer: Self-pay | Admitting: Internal Medicine

## 2024-03-28 DIAGNOSIS — M542 Cervicalgia: Secondary | ICD-10-CM

## 2024-03-28 MED ORDER — CYCLOBENZAPRINE HCL 10 MG PO TABS: 10.0000 mg | ORAL_TABLET | Freq: Two times a day (BID) | ORAL | 2 refills | Status: DC | PRN

## 2024-03-30 ENCOUNTER — Other Ambulatory Visit: Payer: Self-pay | Admitting: Internal Medicine

## 2024-03-30 DIAGNOSIS — M542 Cervicalgia: Secondary | ICD-10-CM

## 2024-03-30 MED ORDER — CYCLOBENZAPRINE HCL 10 MG PO TABS: 10.0000 mg | ORAL_TABLET | Freq: Two times a day (BID) | ORAL | 5 refills | Status: AC | PRN

## 2024-04-21 ENCOUNTER — Other Ambulatory Visit: Payer: Self-pay | Admitting: Internal Medicine

## 2024-04-21 NOTE — Telephone Encounter (Unsigned)
 Copied from CRM [id number]. Topic: Clinical - Medication Refill >> Apr 21, 2024  4:27 PM Ashley R wrote: Medication: amphetamine -dextroamphetamine  (ADDERALL) 30 MG tablet  Has the patient contacted their pharmacy? Yes   This is the patient's preferred pharmacy:  [id number] [place], [place] - [address] [place] [address] Phone: [phone] Fax: [phone]   Is this the correct pharmacy for this prescription? Yes If no, delete pharmacy and type the correct one.   Has the prescription been filled recently? Yes  Is the patient out of the medication? Yes  Has the patient been seen for an appointment in the last year OR does the patient have an upcoming appointment? Yes  Can we respond through MyChart? Yes  Agent: Please be advised that Rx refills may take up to 3 business days. We ask that you follow-up with your pharmacy.

## 2024-04-22 MED ORDER — AMPHETAMINE-DEXTROAMPHETAMINE 30 MG PO TABS: 30.0000 mg | ORAL_TABLET | Freq: Every day | ORAL | 0 refills | Status: AC

## 2024-05-29 ENCOUNTER — Other Ambulatory Visit: Payer: Self-pay | Admitting: Internal Medicine

## 2024-05-29 DIAGNOSIS — F411 Generalized anxiety disorder: Secondary | ICD-10-CM
